# Patient Record
Sex: Female | Born: 1935 | Race: White | Hispanic: No | Smoking: Never smoker
Health system: Southern US, Community
[De-identification: ages and names within clinical notes are randomized; demographics above are authoritative.]

## PROBLEM LIST (undated history)

## (undated) DIAGNOSIS — R339 Retention of urine, unspecified: Secondary | ICD-10-CM

## (undated) DIAGNOSIS — I2699 Other pulmonary embolism without acute cor pulmonale: Secondary | ICD-10-CM

## (undated) DIAGNOSIS — E785 Hyperlipidemia, unspecified: Secondary | ICD-10-CM

## (undated) DIAGNOSIS — F209 Schizophrenia, unspecified: Secondary | ICD-10-CM

## (undated) DIAGNOSIS — I10 Essential (primary) hypertension: Secondary | ICD-10-CM

## (undated) DIAGNOSIS — I5030 Unspecified diastolic (congestive) heart failure: Secondary | ICD-10-CM

## (undated) HISTORY — PX: ABDOMINAL HYSTERECTOMY: SHX81

## (undated) HISTORY — PX: CHOLECYSTECTOMY: SHX55

---

## 2004-03-30 ENCOUNTER — Emergency Department (HOSPITAL_COMMUNITY): Admission: EM | Admit: 2004-03-30 | Discharge: 2004-03-30 | Payer: Self-pay | Admitting: Emergency Medicine

## 2005-07-01 ENCOUNTER — Inpatient Hospital Stay (HOSPITAL_COMMUNITY): Admission: EM | Admit: 2005-07-01 | Discharge: 2005-07-06 | Payer: Self-pay | Admitting: Emergency Medicine

## 2007-10-07 ENCOUNTER — Emergency Department (HOSPITAL_COMMUNITY): Admission: EM | Admit: 2007-10-07 | Discharge: 2007-10-07 | Payer: Self-pay | Admitting: Emergency Medicine

## 2008-09-14 ENCOUNTER — Emergency Department (HOSPITAL_COMMUNITY): Admission: EM | Admit: 2008-09-14 | Discharge: 2008-09-14 | Payer: Self-pay | Admitting: Emergency Medicine

## 2010-09-11 ENCOUNTER — Emergency Department (HOSPITAL_COMMUNITY)
Admission: EM | Admit: 2010-09-11 | Discharge: 2010-09-12 | Disposition: A | Discharge status: Skilled Nursing Facility | Payer: Medicare Other | Attending: Emergency Medicine | Admitting: Emergency Medicine

## 2010-09-11 ENCOUNTER — Emergency Department (HOSPITAL_COMMUNITY): Payer: Medicare Other

## 2010-09-11 DIAGNOSIS — S1093XA Contusion of unspecified part of neck, initial encounter: Secondary | ICD-10-CM | POA: Insufficient documentation

## 2010-09-11 DIAGNOSIS — E119 Type 2 diabetes mellitus without complications: Secondary | ICD-10-CM | POA: Insufficient documentation

## 2010-09-11 DIAGNOSIS — I10 Essential (primary) hypertension: Secondary | ICD-10-CM | POA: Insufficient documentation

## 2010-09-11 DIAGNOSIS — Y9210 Unspecified residential institution as the place of occurrence of the external cause: Secondary | ICD-10-CM | POA: Insufficient documentation

## 2010-09-11 DIAGNOSIS — E785 Hyperlipidemia, unspecified: Secondary | ICD-10-CM | POA: Insufficient documentation

## 2010-09-11 DIAGNOSIS — S0003XA Contusion of scalp, initial encounter: Secondary | ICD-10-CM | POA: Insufficient documentation

## 2010-09-11 DIAGNOSIS — W1809XA Striking against other object with subsequent fall, initial encounter: Secondary | ICD-10-CM | POA: Insufficient documentation

## 2010-09-12 LAB — GLUCOSE, CAPILLARY: Glucose-Capillary: 110 mg/dL — ABNORMAL HIGH (ref 70–99)

## 2010-09-17 ENCOUNTER — Emergency Department (HOSPITAL_COMMUNITY)
Admission: EM | Admit: 2010-09-17 | Discharge: 2010-09-17 | Disposition: A | Discharge status: Home / Self Care | Payer: Medicare Other | Attending: Emergency Medicine | Admitting: Emergency Medicine

## 2010-09-17 ENCOUNTER — Emergency Department (HOSPITAL_COMMUNITY): Payer: Medicare Other

## 2010-09-17 DIAGNOSIS — I517 Cardiomegaly: Secondary | ICD-10-CM | POA: Insufficient documentation

## 2010-09-17 DIAGNOSIS — E785 Hyperlipidemia, unspecified: Secondary | ICD-10-CM | POA: Insufficient documentation

## 2010-09-17 DIAGNOSIS — S0003XA Contusion of scalp, initial encounter: Secondary | ICD-10-CM | POA: Insufficient documentation

## 2010-09-17 DIAGNOSIS — Y9210 Unspecified residential institution as the place of occurrence of the external cause: Secondary | ICD-10-CM | POA: Insufficient documentation

## 2010-09-17 DIAGNOSIS — E119 Type 2 diabetes mellitus without complications: Secondary | ICD-10-CM | POA: Insufficient documentation

## 2010-09-17 DIAGNOSIS — I10 Essential (primary) hypertension: Secondary | ICD-10-CM | POA: Insufficient documentation

## 2010-09-17 DIAGNOSIS — E041 Nontoxic single thyroid nodule: Secondary | ICD-10-CM | POA: Insufficient documentation

## 2010-09-17 DIAGNOSIS — W010XXA Fall on same level from slipping, tripping and stumbling without subsequent striking against object, initial encounter: Secondary | ICD-10-CM | POA: Insufficient documentation

## 2010-09-17 DIAGNOSIS — S1093XA Contusion of unspecified part of neck, initial encounter: Secondary | ICD-10-CM | POA: Insufficient documentation

## 2010-09-17 LAB — GLUCOSE, CAPILLARY: Glucose-Capillary: 101 mg/dL — ABNORMAL HIGH (ref 70–99)

## 2010-09-29 LAB — URINALYSIS, ROUTINE W REFLEX MICROSCOPIC
Bilirubin Urine: NEGATIVE
Glucose, UA: NEGATIVE mg/dL
Ketones, ur: NEGATIVE mg/dL
Leukocytes, UA: NEGATIVE
Urobilinogen, UA: 0.2 mg/dL (ref 0.0–1.0)
pH: 6 (ref 5.0–8.0)

## 2010-09-29 LAB — POCT I-STAT, CHEM 8
BUN: 25 mg/dL — ABNORMAL HIGH (ref 6–23)
Calcium, Ion: 1 mmol/L — ABNORMAL LOW (ref 1.12–1.32)
Creatinine, Ser: 0.9 mg/dL (ref 0.4–1.2)
Glucose, Bld: 76 mg/dL (ref 70–99)
Potassium: 3.2 mEq/L — ABNORMAL LOW (ref 3.5–5.1)
TCO2: 18 mmol/L (ref 0–100)

## 2010-09-29 LAB — URINE MICROSCOPIC-ADD ON

## 2010-11-04 NOTE — Discharge Summary (Signed)
NAME:  [name], [name] NO.:  [id number]   MEDICAL RECORD NO.:  [id number]          PATIENT TYPE:  INP   LOCATION:  1324                         FACILITY:  [facility]   PHYSICIAN:  Deirdre Peer. Polite, M.D. DATE OF BIRTH:  1934/09/03   DATE OF ADMISSION:  07/01/2005  DATE OF DISCHARGE:                                 DISCHARGE SUMMARY   DISCHARGE DIAGNOSES:  1.  Mental status change multifactorial in etiology, improved at time of      discharge.  2.  Uncontrolled diabetes. Hemoglobin A1c 10.3. Please note outpatient      noncompliance with medications. New medications started Amaryl, Lantus,      actos.  3.  Hypertension, stable.  4.  Schizophrenia appears to be at baseline. Will continue with Zyprexa 20      mg q.h.s.  5.  Poor safety awareness requiring assisted living care.   DISCHARGE MEDICATIONS:  1.  Actos 30 mg daily.  2.  Amaryl 8 mg daily.  3.  Lantus 26 units daily.  4.  Zyprexa 20 mg daily.  5.  Lisinopril 20 mg daily.  6.  Hydrochlorothiazide 25 mg daily.  7.  Lipitor 20 mg daily.   Please note to consider Aricept to complete resolution of acute psychosis at  5 mg daily.   DISPOSITION:  The patient to be discharged to assisted living facility.   CONSULTATIONS:  Antonietta Breach, M.D., psychiatry.   HISTORY OF PRESENT ILLNESS:  A 76 year old female with above medical  problems brought to the ED for confusion. In the ED, the patient was  evaluated, had screening labs without any significant abnormalities other  than hyperglycemia. History suggested the patient has been not caring for  herself well at home and having some stool incontinence as well as bladder  incontinence. Admission was deemed necessary for further evaluation and  treatment. Please see dictated H&P for further details.   PAST MEDICAL HISTORY:  As stated above.   PAST SURGICAL HISTORY:  Significant for cholecystectomy, hysterectomy.   ALLERGIES:  None.   MEDICATIONS:  As stated  above.   FAMILY HISTORY:  Noncontributory.   HOSPITAL COURSE:  The patient was admitted to a medicine floor bed for  evaluation and treatment of mental status change. The patient was pan  cultured, UA was negative, C Dif negative. The patient had BMET, CBC which  were within normal limits. The patient's hemoglobin A1c was elevated at 10.2  suggesting the need for more medicines versus noncompliance with her  existing medications. The patient was seen in consultation by psychiatry and  was recommended to continue Zyprexa and to consider  Aricept after acute  psychosis resolved. There were no complications during this hospitalization  except for mild hyperglycemia and the patient's medications were titrated  appropriately. The patient at this time will be discharged to an assisted  living facility per the family's request and will primarily need titration  of her psychotropic medicines. May need addition of Aricept and monitoring  of her CBGs and shifting her medicines appropriately. At this time, the  patient is medically  stable for discharge.      Deirdre Peer. Polite, M.D.  Electronically Signed     RDP/MEDQ  D:  07/06/2005  T:  07/06/2005  Job:  [id number]   cc:   Dr. __________

## 2010-11-04 NOTE — H&P (Signed)
NAME:  [name], [name] NO.:  [id number]   MEDICAL RECORD NO.:  [id number]          PATIENT TYPE:  INP   LOCATION:  1324                         FACILITY:  [facility]   PHYSICIAN:  Thora Lance, M.D.  DATE OF BIRTH:  07/14/1934   DATE OF ADMISSION:  07/01/2005  DATE OF DISCHARGE:                                HISTORY & PHYSICAL   CHIEF COMPLAINT:  Confusion.   HISTORY OF PRESENT ILLNESS:  This is a 76 year old white female who is  accompanied by her son, a Occupational psychologist, Dr. Deanne Coffer. The patient has a  history of diabetes mellitus, hypertension, and schizophrenia. The patient  was noticed by her son last night to be modestly confused. The patient is on  medications for diabetes, high cholesterol, hypertension, and schizophrenia  but admits to not taking any of her Lipitor, lisinopril, and metformin over  the last at least 3 weeks and only taking her Zyprexa sporadically. She has  probably been having more frequent urination and has lost some weight. She  has had problems with fecal and urinary incontinence per her son. Her son  reports that she has had increased paranoia with decreased sleep and  possibly hallucinations which she terms interrupters. The patient has a  visiting nurse which checks on her several times a week but she has not let  her into the house over the last several visits. The patient lives alone but  her son feels that given her psychiatric problems that placement is more  appropriate at this time and he has been visiting assisted living  facilities.   PAST MEDICAL HISTORY:  1.  Hypertension.  2.  Diabetes mellitus.  3.  Schizophrenia, Dr. Earnstine Regal.   PAST SURGICAL HISTORY:  Cholecystectomy and total abdominal hysterectomy.   ALLERGIES:  None.   MEDICATIONS:  As prescribed:  1.  Zyprexa 20 mg p.o. q.h.s.  2.  Lipitor 20 mg p.o. q.a.m.  3.  Lisinopril/hydrochlorothiazide 20/25 mg p.o. daily.  4.  Metformin 850 mg p.o. b.i.d.   FAMILY HISTORY:  Unremarkable.   SOCIAL HISTORY:  The patient is divorced. She lives alone. She drinks  alcohol sparingly. She is a nonsmoker.   REVIEW OF SYSTEMS:  Believes she has lost some weight in the last few weeks.  She denies fevers, chills, cough, chest pain, abdominal pain, diarrhea,  constipation, urinary pain, headache.   PHYSICAL EXAMINATION:  GENERAL:  Alert elderly female. Her speech is  rambling and tangential.  VITAL SIGNS:  Temperature 98.7. Blood pressure initially 198/120, rechecked  158/90 in the right, 84/56 on the left. Heart 101, respirations 16, oxygen  saturation 94% on room air.  HEENT:  Pupils equal and respond to light. Extraocular movements are intact.  Fundi within normal limits. TMs are clear. Oropharynx is clear.  NECK:  Supple, no lymphadenopathy, no carotid bruits.  LUNGS:  Clear.  HEART:  Regular rate and rhythm without murmur, gallop, or rub.  ABDOMEN:  Soft, nontender, normal bowel sounds, no masses.  PELVIC AND RECTAL:  Deferred.  EXTREMITIES:  Show no edema. There is no callus  or ulceration on her feet,  2+ pedal pulses.  NEUROLOGIC:  Nonfocal. Cranial nerves II-XII are intact. Strength is 5/5 in  all extremities.   LABORATORY:  CBC:  WBC 9.1, hemoglobin 15.4, platelet count 286. Chemistry:  Sodium 130, potassium 4.0, chloride 94, bicarbonate 28, BUN 10, creatinine  0.7, platelets 355. Liver function tests normal. Urinalysis pending. CT scan  of the brain shows chronic ischemic white matter disease, no acute changes.   ASSESSMENT:  1.  Mild confusion.  2.  Schizophrenia with active symptoms of paranoia and probable      hallucinations. She is off medications.  3.  Diabetes mellitus, poorly controlled, off medications.  4.  Hypertension, poorly controlled, off medications.   PLAN:  Admit to medical bed. Restart medications. IV fluids. Sliding scale  insulin. Physical therapy. Case manager consult for placement.            ______________________________  Thora Lance, M.D.     JJG/MEDQ  D:  07/01/2005  T:  07/02/2005  Job:  [id number]   cc:   Deatra James, M.D.  Fax: [phone]

## 2010-12-02 ENCOUNTER — Emergency Department (HOSPITAL_COMMUNITY): Payer: Medicare Other

## 2010-12-02 ENCOUNTER — Inpatient Hospital Stay (HOSPITAL_COMMUNITY)
Admission: EM | Admit: 2010-12-02 | Discharge: 2010-12-05 | DRG: 638 | Disposition: A | Discharge status: Nursing Home (Includes ICF) | Payer: Medicare Other | Attending: Internal Medicine | Admitting: Internal Medicine

## 2010-12-02 DIAGNOSIS — E785 Hyperlipidemia, unspecified: Secondary | ICD-10-CM | POA: Diagnosis present

## 2010-12-02 DIAGNOSIS — E1169 Type 2 diabetes mellitus with other specified complication: Principal | ICD-10-CM | POA: Diagnosis present

## 2010-12-02 DIAGNOSIS — W06XXXA Fall from bed, initial encounter: Secondary | ICD-10-CM | POA: Diagnosis present

## 2010-12-02 DIAGNOSIS — N318 Other neuromuscular dysfunction of bladder: Secondary | ICD-10-CM | POA: Diagnosis present

## 2010-12-02 DIAGNOSIS — N390 Urinary tract infection, site not specified: Secondary | ICD-10-CM | POA: Diagnosis present

## 2010-12-02 DIAGNOSIS — S0100XA Unspecified open wound of scalp, initial encounter: Secondary | ICD-10-CM | POA: Diagnosis present

## 2010-12-02 DIAGNOSIS — F0390 Unspecified dementia without behavioral disturbance: Secondary | ICD-10-CM | POA: Diagnosis present

## 2010-12-02 DIAGNOSIS — Y9210 Unspecified residential institution as the place of occurrence of the external cause: Secondary | ICD-10-CM | POA: Diagnosis present

## 2010-12-02 DIAGNOSIS — IMO0002 Reserved for concepts with insufficient information to code with codable children: Secondary | ICD-10-CM | POA: Diagnosis present

## 2010-12-02 DIAGNOSIS — F209 Schizophrenia, unspecified: Secondary | ICD-10-CM | POA: Diagnosis present

## 2010-12-02 DIAGNOSIS — I10 Essential (primary) hypertension: Secondary | ICD-10-CM | POA: Diagnosis present

## 2010-12-02 LAB — GLUCOSE, CAPILLARY

## 2010-12-02 LAB — CBC
Hemoglobin: 13.8 g/dL (ref 12.0–15.0)
MCV: 97.9 fL (ref 78.0–100.0)
RBC: 4.21 MIL/uL (ref 3.87–5.11)
RDW: 14 % (ref 11.5–15.5)

## 2010-12-03 LAB — GLUCOSE, CAPILLARY
Glucose-Capillary: 55 mg/dL — ABNORMAL LOW (ref 70–99)
Glucose-Capillary: 90 mg/dL (ref 70–99)
Glucose-Capillary: 76 mg/dL (ref 70–99)
Glucose-Capillary: 108 mg/dL — ABNORMAL HIGH (ref 70–99)
Glucose-Capillary: 75 mg/dL (ref 70–99)
Glucose-Capillary: 84 mg/dL (ref 70–99)
Glucose-Capillary: 101 mg/dL — ABNORMAL HIGH (ref 70–99)

## 2010-12-03 LAB — BASIC METABOLIC PANEL
Calcium: 8.7 mg/dL (ref 8.4–10.5)
GFR calc Af Amer: >60 mL/min (ref >60)
GFR calc Af Amer: >60 mL/min (ref >60)
GFR calc non Af Amer: >60 mL/min (ref >60)
GFR calc non Af Amer: >60 mL/min (ref >60)
Glucose, Bld: 52 mg/dL — ABNORMAL LOW (ref 70–99)
Potassium: 3.5 mEq/L (ref 3.5–5.1)
Potassium: 3.9 mEq/L (ref 3.5–5.1)
Sodium: 137 mEq/L (ref 135–145)
Sodium: 140 mEq/L (ref 135–145)

## 2010-12-03 LAB — URINALYSIS, ROUTINE W REFLEX MICROSCOPIC
Glucose, UA: NEGATIVE mg/dL
Protein, ur: NEGATIVE mg/dL
pH: 6.5 (ref 5.0–8.0)

## 2010-12-03 LAB — CBC
MCH: 32.3 pg (ref 26.0–34.0)
MCHC: 32.5 g/dL (ref 30.0–36.0)
RDW: 14.1 % (ref 11.5–15.5)

## 2010-12-03 LAB — URINE MICROSCOPIC-ADD ON

## 2010-12-04 LAB — GLUCOSE, CAPILLARY
Glucose-Capillary: 112 mg/dL — ABNORMAL HIGH (ref 70–99)
Glucose-Capillary: 126 mg/dL — ABNORMAL HIGH (ref 70–99)
Glucose-Capillary: 131 mg/dL — ABNORMAL HIGH (ref 70–99)
Glucose-Capillary: 146 mg/dL — ABNORMAL HIGH (ref 70–99)
Glucose-Capillary: 92 mg/dL (ref 70–99)

## 2010-12-05 LAB — URINALYSIS, ROUTINE W REFLEX MICROSCOPIC
Bilirubin Urine: NEGATIVE
Glucose, UA: NEGATIVE mg/dL
Hgb urine dipstick: NEGATIVE
Ketones, ur: NEGATIVE mg/dL
Specific Gravity, Urine: 1.011 (ref 1.005–1.030)
pH: 6 (ref 5.0–8.0)

## 2010-12-05 LAB — GLUCOSE, CAPILLARY

## 2010-12-05 LAB — BASIC METABOLIC PANEL
CO2: 28 mEq/L (ref 19–32)
Calcium: 9.1 mg/dL (ref 8.4–10.5)
Creatinine, Ser: 0.54 mg/dL (ref 0.50–1.10)
GFR calc non Af Amer: >60 mL/min (ref >60)
Glucose, Bld: 92 mg/dL (ref 70–99)
Sodium: 138 mEq/L (ref 135–145)

## 2010-12-06 LAB — URINE CULTURE
Colony Count: NO GROWTH
Culture  Setup Time: 201206181355
Culture: NO GROWTH
Special Requests: NEGATIVE

## 2010-12-12 ENCOUNTER — Emergency Department (HOSPITAL_COMMUNITY)
Admission: EM | Admit: 2010-12-12 | Discharge: 2010-12-12 | Disposition: A | Discharge status: Home / Self Care | Payer: Medicare Other | Attending: Emergency Medicine | Admitting: Emergency Medicine

## 2010-12-12 ENCOUNTER — Emergency Department (HOSPITAL_COMMUNITY): Payer: Medicare Other

## 2010-12-12 DIAGNOSIS — Y9210 Unspecified residential institution as the place of occurrence of the external cause: Secondary | ICD-10-CM | POA: Insufficient documentation

## 2010-12-12 DIAGNOSIS — S0990XA Unspecified injury of head, initial encounter: Secondary | ICD-10-CM | POA: Insufficient documentation

## 2010-12-12 DIAGNOSIS — Z79899 Other long term (current) drug therapy: Secondary | ICD-10-CM | POA: Insufficient documentation

## 2010-12-12 DIAGNOSIS — M47812 Spondylosis without myelopathy or radiculopathy, cervical region: Secondary | ICD-10-CM | POA: Insufficient documentation

## 2010-12-12 DIAGNOSIS — S022XXA Fracture of nasal bones, initial encounter for closed fracture: Secondary | ICD-10-CM | POA: Insufficient documentation

## 2010-12-12 DIAGNOSIS — W010XXA Fall on same level from slipping, tripping and stumbling without subsequent striking against object, initial encounter: Secondary | ICD-10-CM | POA: Insufficient documentation

## 2010-12-12 DIAGNOSIS — E785 Hyperlipidemia, unspecified: Secondary | ICD-10-CM | POA: Insufficient documentation

## 2010-12-12 DIAGNOSIS — I10 Essential (primary) hypertension: Secondary | ICD-10-CM | POA: Insufficient documentation

## 2010-12-12 DIAGNOSIS — E119 Type 2 diabetes mellitus without complications: Secondary | ICD-10-CM | POA: Insufficient documentation

## 2010-12-22 NOTE — Discharge Summary (Signed)
[name], [name] NO.:  [id number]  MEDICAL RECORD NO.:  [id number]  LOCATION:  1517                         FACILITY:  [facility]  PHYSICIAN:  Clydia Llano, MD       DATE OF BIRTH:  17-Nov-1934  DATE OF ADMISSION:  12/02/2010 DATE OF DISCHARGE:                        DISCHARGE SUMMARY - REFERRING   REASON FOR ADMISSION:  Fall.  DISCHARGE DIAGNOSES: 1. Hypoglycemia in type 2 diabetes. 2. Type 2 diabetes mellitus. 3. Status post fall secondary to hypoglycemia. 4. Dyslipidemia. 5. Schizophrenia. 6. Hypertension. 7. Dementia. 8. Overactive bladder. 9. Questionable UTI.  DISCHARGE MEDICATIONS: 1. Ciprofloxacin 500 mg b.i.d. take for 4 days. 2. Diclofenac 50 mg b.i.d. as needed for pain. 3. Aspirin 81 mg p.o. daily. 4. Benztropine 1 mg p.o. b.i.d. 5. Enablex 7.5 mg p.o. daily. 6. Fish oil 1000 mg p.o. b.i.d. 7. Lisinopril 10 mg p.o. daily. 8. Nystatin 100,000 units per gram powder applied topically twice    daily as needed to affected area. 9. Perphenazine 8 mg take 1 tablet twice daily at 9 and 2 p.m. and 5     tablets at bedtime at 9. 10.Simvastatin 40 mg p.o. q.h.s. 11.Therapeutic gel 0.5% shampoo applied topically as needed for     washing hair. 12.Vitamin E 200 units 1 capsule p.o. daily.  Stop taking the following medications: 1. Actos 45 mg p.o. daily. 2. Glimepiride 4 mg p.o. daily.  Hold the following medications: 1. Amaryl 4 mg p.o. daily. 2. Actos 45 mg p.o. daily.  RADIOLOGY: 1. Chest x-ray on December 03, 2010, cardiac enlargement, hypoventilation     with bibasilar atelectasis. 2. CT scan of the head negative for cervical fracture.  CT scan of the     head showed chronic microvascular ischemia. 3. CT scan of the spine on December 03, 2010, showed negative for cervical     fracture.  There is 2.4 cm left lower lobe thyroid nodule.     Recommend thyroid ultrasound on elective basis.  BRIEF HISTORY EXAMINATION:  [name] is a 76 year old  female from [facility], reports to fell out of the bed and hit in the back of her head today.  The patient has history diabetes, hypertension and schizophrenia and dementia.  The patient taking oral hypoglycemic agents Actos and glimepiride.  Her blood sugar was checked and she is being found to have 47.  EMS gave D50 and when the patient arrived in the emergency department, her blood sugar was found to be in the 80s.  Her blood sugar will not be able to sustain above 80.  The patient was started on D5 with normal saline and that time the patient reported not eating well.  She was started recently on Bactrim.  BRIEF HOSPITAL STAY: 1. Hypoglycemia and type 2 diabetes mellitus.  The hypoglycemia is     likely secondary to poor oral intake and the effect of the oral     hypoglycemic agents.  The patient was started recently on Bactrim     likely because of UTI.  Both Bactrim and UTI caused loss of     appetite and this has probably contributed to the hypoglycemia.  The patient was put on D5 half normal saline for 1 day and the next     day kept monitored in the hospital to check her blood sugar.  Blood     sugar has been stable with the oral hypoglycemic on hold, the     highest blood sugar reached is 131.  So it was also held on     discharge and that might be restarted if necessary but for now the     patient does not need them.  The patient will be on carbohydrate-     modified diet. 2. Status post fall.  The patient fell.  She had some abrasion in the     left great toe and scalp and bruises in the left forearm.  This is     likely secondary to the fall like secondary to the hypoglycemia.     The patient has been stable in the hospital. 3. Diabetes mellitus type 2.  As in #1, oral hypoglycemic will be on     hold.  Restarting these medication need to be readdressed at the     nursing home. 4. Schizophrenia and dementia, stable.  The patient did not show any      psychotic or depressive mood in the hospital.  Her dementia has     also been stable. 5. Questionable UTI.  The patient has been recently on Bactrim, my     guess it was because of UTI.  The patient upon admission to the     hospital has urine with positive leukocyte esterase but no pyuria.     The patient was started on ciprofloxacin because she had low grade     fever in the hospital and I will treat her for 5 days empirically.  DISCHARGE INSTRUCTIONS: 1. Disposition:  Skilled nursing facility. 2. Activity:  As tolerated. 3. Diet:  Carbohydrate-modified diabetic diet.     Clydia Llano, MD     ME/MEDQ  D:  12/05/2010  T:  12/05/2010  Job:  [id number]  cc:   Deatra James, M.D. Fax: [phone]  Electronically Signed by Clydia Llano  on 12/22/2010 02:35:24 PM

## 2011-01-09 NOTE — H&P (Signed)
[name], [name] NO.:  [id number]  MEDICAL RECORD NO.:  [id number]  LOCATION:  1517                         FACILITY:  [facility]  PHYSICIAN:  Della Goo, M.D. DATE OF BIRTH:  December 09, 1934  DATE OF ADMISSION:  12/02/2010 DATE OF DISCHARGE:                             HISTORY & PHYSICAL   PRIMARY CARE PHYSICIAN:  [facility], Dr. Wynelle Link.  CHIEF COMPLAINT:  Fall.  HISTORY OF PRESENT ILLNESS:  This is a 76 year old female from the [facility] who reportedly fell out of bed, hitting the back of her head.  She has a history of diabetes and is on oral diabetic medications of Actos and glimepiride, so a blood sugar was checked and she had been found to have a glucose level of 47.  EMS gave D50, and when the patient arrived in the emergency department her blood sugar was found to be 80.  Her blood sugars were not able to sustain above 80 and the patient had to be administered D50 twice and then was placed on an IV drip of D5 normal saline.  The patient reports not eating well over the past 24 hours, she states her last meal was at breakfast.  She reports not eating lunch or dinner.  PAST MEDICAL HISTORY: 1. Type 2 diabetes mellitus. 2. Dyslipidemia. 3. Hypertension. 4. Schizophrenia. 5. Dementia. 6. Overactive bladder.  MEDICATIONS:  Aspirin, Enablex, lisinopril, vitamin D, perphenazine, benztropine, fish oil, simvastatin, nystatin powder, and Septra.  ALLERGIES:  No known drug allergies.  SOCIAL HISTORY:  The patient resides at the [facility].  She is a nonsmoker, nondrinker and has no history of illicit drug usage.  FAMILY HISTORY:  Unable to obtain.  REVIEW OF SYSTEMS:  Unable to obtain.  PHYSICAL EXAMINATION:  GENERAL:  This is a morbidly obese 76 year old Caucasian female who is in no acute distress currently. VITAL SIGNS:  Temperature 98.2, blood pressure 109/71, heart rate  91, respirations 18, O2 saturations 88% to 98% on supplemental oxygen. HEENT:  Normocephalic, atraumatic except for a posterior scalp laceration that has 1 staple that has been placed to oppose the margins. Pupils are equally round and reactive to light.  Extraocular movements are intact.  Funduscopic benign.  There is no scleral icterus.  Nares are patent bilaterally.  Oropharynx is clear.  There are no tongue lacerations.  Dentition is poor. NECK:  Supple.  Full range of motion.  No thyromegaly, adenopathy, or jugular venous distention. CARDIOVASCULAR:  Regular rate and rhythm.  Normal S1 and S2.  No murmurs, gallops, or rubs appreciated.  Chest wall nontender.  No visible ecchymoses, masses, lesions, or ulcers present.  Nontender to palpation. LUNGS:  Clear to auscultation bilaterally.  No rales, rhonchi, or wheezes. ABDOMEN:  Positive bowel sounds.  Soft, nontender, and nondistended.  No hepatosplenomegaly.  Obese. EXTREMITIES:  Without cyanosis, clubbing, or edema. NEUROLOGIC:  The patient is alert and oriented x3.  Her speech is garbled, this is chronic.  She is able to move all 4 of her extremities. SKIN:  The patient has the posterior scalp laceration present.  She also has an abrasion on the medial aspect  of the left first metatarsal area. The patient has small bruises on the anterior and distal aspects of both lower extremities.  LABORATORY STUDIES:  White blood cell count 11.9, hemoglobin 13.8, hematocrit 41.2, MCV 97.9, platelets 152.  Sodium 137, potassium 3.5, chloride 102, carbon dioxide 25, BUN 11, creatinine 0.84, and glucose 52.  Urinalysis negative except for trace leukocyte esterase.  Urine white blood cells 0-2, urine red blood cells 0-2.  RADIOLOGICAL STUDIES:  CT scan of the head, negative for any acute findings of infarction, hemorrhage, or mass lesion.  Negative for skull fracture.  Mild chronic microvascular ischemic changes are seen and they are  unchanged.  Age-appropriate atrophy is also seen.  CT scan of the C- spine, negative for fracture.  Cervical kyphosis is present.  Chest x- ray reveals mild cardiac enlargement and hypoventilation with bibasilar atelectasis seen.  ASSESSMENT:  A 76 year old female being admitted with, 1. Hypoglycemia, most likely secondary to poor p.o. intake and being     on oral hypoglycemic medications. 2. Hypoxemia. 3. Hypertension. 4. Dyslipidemia. 5. Schizophrenia. 6. Dementia. 7. Morbid obesity. 8. Posterior scalp laceration. 9. Abrasion, left foot. 10.Status post fall.  PLAN:  The patient will be admitted to telemetry area for monitoring and a continuous pulse oximetry has also been ordered to monitor her O2 saturations.  Supplemental oxygen has also been ordered.  The patient has been placed on the IV drip with D5 normal saline to help stabilize her blood sugars for now and her blood sugars will be checked every hour x6 and then q.4 h. with sliding scale insulin coverage.  The Actos therapy that she has been on along with the glimepiride therapy has been discontinued for now.  The hypoglycemic protocol will be followed and her regularmedications will be further reconciled and further workup will ensue pending results of the patient's clinical course.  Wound care will be provided for the posterior scalp laceration and the foot wound.  The patient will be placed on DVT prophylaxis and code status is a full code at this time.     Della Goo, M.D.     HJ/MEDQ  D:  12/03/2010  T:  12/03/2010  Job:  [id number]  Electronically Signed by Della Goo M.D. on 01/09/2011 11:06:49 AM

## 2011-03-14 LAB — URINALYSIS, ROUTINE W REFLEX MICROSCOPIC
Bilirubin Urine: NEGATIVE
Glucose, UA: NEGATIVE
Hgb urine dipstick: NEGATIVE
Ketones, ur: NEGATIVE
Protein, ur: NEGATIVE
Urobilinogen, UA: 1

## 2011-03-14 LAB — POCT I-STAT, CHEM 8
BUN: 15
Calcium, Ion: 1.2
Creatinine, Ser: 1.1
Hemoglobin: 13.3
Sodium: 139
TCO2: 30

## 2011-03-14 LAB — URINE MICROSCOPIC-ADD ON

## 2011-03-14 LAB — URINE CULTURE: Colony Count: >=100000

## 2011-10-29 ENCOUNTER — Emergency Department (HOSPITAL_COMMUNITY)
Admission: EM | Admit: 2011-10-29 | Discharge: 2011-10-29 | Disposition: A | Discharge status: Skilled Nursing Facility | Payer: Medicare Other | Attending: Emergency Medicine | Admitting: Emergency Medicine

## 2011-10-29 ENCOUNTER — Encounter (HOSPITAL_COMMUNITY): Payer: Self-pay | Admitting: Emergency Medicine

## 2011-10-29 ENCOUNTER — Emergency Department (HOSPITAL_COMMUNITY): Payer: Medicare Other

## 2011-10-29 DIAGNOSIS — Z8659 Personal history of other mental and behavioral disorders: Secondary | ICD-10-CM | POA: Insufficient documentation

## 2011-10-29 DIAGNOSIS — E119 Type 2 diabetes mellitus without complications: Secondary | ICD-10-CM | POA: Insufficient documentation

## 2011-10-29 DIAGNOSIS — I10 Essential (primary) hypertension: Secondary | ICD-10-CM | POA: Insufficient documentation

## 2011-10-29 DIAGNOSIS — S0003XA Contusion of scalp, initial encounter: Secondary | ICD-10-CM | POA: Insufficient documentation

## 2011-10-29 DIAGNOSIS — Z79899 Other long term (current) drug therapy: Secondary | ICD-10-CM | POA: Insufficient documentation

## 2011-10-29 DIAGNOSIS — W19XXXA Unspecified fall, initial encounter: Secondary | ICD-10-CM

## 2011-10-29 DIAGNOSIS — S1093XA Contusion of unspecified part of neck, initial encounter: Secondary | ICD-10-CM | POA: Insufficient documentation

## 2011-10-29 DIAGNOSIS — W07XXXA Fall from chair, initial encounter: Secondary | ICD-10-CM | POA: Insufficient documentation

## 2011-10-29 DIAGNOSIS — I491 Atrial premature depolarization: Secondary | ICD-10-CM | POA: Insufficient documentation

## 2011-10-29 DIAGNOSIS — Y9210 Unspecified residential institution as the place of occurrence of the external cause: Secondary | ICD-10-CM | POA: Insufficient documentation

## 2011-10-29 DIAGNOSIS — E785 Hyperlipidemia, unspecified: Secondary | ICD-10-CM | POA: Insufficient documentation

## 2011-10-29 DIAGNOSIS — S0093XA Contusion of unspecified part of head, initial encounter: Secondary | ICD-10-CM

## 2011-10-29 HISTORY — DX: Hyperlipidemia, unspecified: E78.5

## 2011-10-29 HISTORY — DX: Essential (primary) hypertension: I10

## 2011-10-29 HISTORY — DX: Schizophrenia, unspecified: F20.9

## 2011-10-29 NOTE — ED Notes (Addendum)
During routine vitals pt's 02 level stated 87%. Johnny Bridge RN was notified. Pt probe was changed and alternated to different fingers and then relocated to pt's ear. Oxygen saturation continued to stat 90%. Pt's heart rate was also noted to had decreased to mid 30s. ERP Lynelle Doctor was notified and pt was placed on EKG monitoring

## 2011-10-29 NOTE — ED Notes (Addendum)
EKG performed and given to ERP [facility] along with previous EKG

## 2011-10-29 NOTE — ED Notes (Addendum)
Pt brought in by EMS from [facility] s/p fall pt sated that she sat in a wooden that was to small and it broke.C/o lower back pain denies loc

## 2011-10-29 NOTE — ED Notes (Signed)
EKG irregular.  To perform 12 lead per Dr. Lynelle Doctor

## 2011-10-29 NOTE — ED Notes (Signed)
Bed:WA22<BR> Expected date:<BR> Expected time:<BR> Means of arrival:<BR> Comments:<BR> EMS fall °

## 2011-10-29 NOTE — ED Notes (Addendum)
Notified by [facility] that pt heart rate ranging from 30-50 on pulse oximetry.  Pt radial pulse is very irregular.  Notified MD.  Place pt on EKG monitor.  Per son, pt does not have hx of irregular heart rate. Also, oxygen level 85-99 % range.

## 2011-10-29 NOTE — ED Notes (Signed)
Oxygen remains upper 80% on ra.  Pleth good on monitor.  Good cap refill.  Hands warm.  Notified MD.  Do ABG.

## 2011-10-29 NOTE — ED Provider Notes (Signed)
History     CSN: [id number]  Arrival date & time 10/29/11  4098   First MD Initiated Contact with Patient 10/29/11 1857      Chief Complaint  Patient presents with  . Fall    (Consider location/radiation/quality/duration/timing/severity/associated sxs/prior treatment) HPI  Patient presents from nursing home. She told me that she was walking without her walker and she fell forward. However EMS told the nurse that she was sitting in a small wooden chair and she fell. When I asked the patient about this she said yes that she was by the chair and she fell. It is not clear whether she was sitting in the chair she was walking by the chair and tripped. Patient also denies any pain to me. EMS reported she was having back pain but she specifically denies it when asked. She points to her right lateral eyebrow and states she has some pain and swelling there. She denies any pain in her extremities  PCP Dr. Wynelle Link  Past Medical History  Diagnosis Date  . Hypertension   . Diabetes mellitus   . Schizophrenia   . Hyperlipemia     Past Surgical History  Procedure Date  . Cholecystectomy     No family history on file.  History  Substance Use Topics  . Smoking status: No  . Smokeless tobacco: Not on file  . Alcohol Use: No  lives in NH Uses a walker  OB History    Grav Para Term Preterm Abortions TAB SAB Ect Mult Living                  Review of Systems  Unable to perform ROS: Psychiatric disorder    Allergies  Review of patient's allergies indicates no known allergies.  Home Medications   Current Outpatient Rx  Name Route Sig Dispense Refill  . ASPIRIN EC 81 MG PO TBEC Oral Take 81 mg by mouth daily.    Marland Kitchen BENZTROPINE MESYLATE 1 MG PO TABS Oral Take 1 mg by mouth 2 (two) times daily.    Marland Kitchen DARIFENACIN HYDROBROMIDE ER 7.5 MG PO TB24 Oral Take 7.5 mg by mouth daily.    . OMEGA-3 FATTY ACIDS 1000 MG PO CAPS Oral Take 2 g by mouth daily.    Marland Kitchen GLIMEPIRIDE 4 MG PO TABS Oral Take  4 mg by mouth daily before breakfast.    . LISINOPRIL 10 MG PO TABS Oral Take 10 mg by mouth daily.    Marland Kitchen LOPERAMIDE HCL 2 MG PO CAPS Oral Take 2 mg by mouth 4 (four) times daily as needed. For diarhea    . PERPHENAZINE 8 MG PO TABS Oral Take 8 mg by mouth 2 (two) times daily.    Marland Kitchen PIOGLITAZONE HCL 30 MG PO TABS Oral Take 30 mg by mouth daily.    Marland Kitchen SIMVASTATIN 40 MG PO TABS Oral Take 40 mg by mouth every evening.    Marland Kitchen VITAMIN E 200 UNITS PO CAPS Oral Take 200 Units by mouth daily.      BP 143/70  Pulse 75  Resp 17  SpO2 97%  Vital signs normal    Physical Exam  Nursing note and vitals reviewed. Constitutional: She appears well-developed and well-nourished.  Non-toxic appearance. She does not appear ill. No distress.  HENT:  Head: Normocephalic.  Right Ear: External ear normal.  Left Ear: External ear normal.  Nose: Nose normal. No mucosal edema or rhinorrhea.  Mouth/Throat: Oropharynx is clear and moist and mucous membranes are normal.  No dental abscesses or uvula swelling.       Patient has a small bruising with swelling of her right forehead in the hairline. Otherwise no other trauma seen to her head or face  Eyes: Conjunctivae and EOM are normal. Pupils are equal, round, and reactive to light.  Neck: Normal range of motion and full passive range of motion without pain. Neck supple.  Cardiovascular: Normal rate, regular rhythm and normal heart sounds.  Exam reveals no gallop and no friction rub.   No murmur heard. Pulmonary/Chest: Effort normal and breath sounds normal. No respiratory distress. She has no wheezes. She has no rhonchi. She has no rales. She exhibits no tenderness and no crepitus.  Abdominal: Soft. Normal appearance and bowel sounds are normal. She exhibits no distension. There is no tenderness. There is no rebound and no guarding.  Musculoskeletal: Normal range of motion. She exhibits no edema and no tenderness.       Moves all extremities well. Her back is nontender  to palpation. She moves freely without apparent discomfort.  Neurological: She is alert. She has normal strength. No cranial nerve deficit.       Patient appears to be a little confused  Skin: Skin is warm, dry and intact. No rash noted. No erythema. No pallor.  Psychiatric: She has a normal mood and affect. Her speech is normal and behavior is normal. Her mood appears not anxious.    ED Course  Procedures (including critical care time)  The patient had pulse ox placed on her finger during her ER visit and she was noted to have irregular heartbeat and some hypoxia. Patient was placed on cardiac monitor she was noted to have a heart rate in the mid 50s up to 70 with frequent PACs. EKG was done and verified the were PACs and not a wenkebach arrhythmia. Her pulse ox was in the high 80s. Blood gas was ordered however the respiratory therapist repositioned the patient and got a better waveform and states her pulse ox was 93 and 96% on room air. At this point her son who is a physician  did not want to pursue the blood gas. He states he will have her followed up with her family physician for the irregular heartbeat. Patient has no chest pain, shortness of breath, and her color is good.` Patient also has no cough.  Labs Reviewed - No data to display Ct Head Wo Contrast  10/29/2011  *RADIOLOGY REPORT*  Clinical Data: Fall.  Right frontal contusion.  CT HEAD WITHOUT CONTRAST  Technique:  Contiguous axial images were obtained from the base of the skull through the vertex without contrast.  Comparison: 12/12/2010  Findings: Vertebral artery atherosclerotic calcification noted. The cerebellum, brain stem, and cerebral peduncles appear unremarkable.  Faint hypodensity in the right thalamus likely represents a remote small lacunar infarct.  The basal ganglia appear unremarkable.  The ventricular system and basilar cisterns appear normal.  Periventricular and corona radiata white matter hypodensities are most  compatible with chronic ischemic microvascular white matter disease.  No intracranial hemorrhage, mass lesion, or acute infarction is identified.  Mild fundal scalp contusion noted.  There is degenerative spurring of the right mandibular condyle.  IMPRESSION:  1.  Mild right frontal scalp contusion.  No acute intracranial findings. 2. Periventricular and corona radiata white matter hypodensities are most compatible with chronic ischemic microvascular white matter disease. 3.  Probable remote lacunar infarct involving the right thalamus. 4.  Degenerative spurring of the right mandibular condyle.  Original Report Authenticated By: Dellia Cloud, M.D.    Date: 10/29/2011  Rate: 67  Rhythm: normal sinus rhythm and premature atrial contractions (PAC)  QRS Axis: normal  Intervals: normal  ST/T Wave abnormalities: normal  Conduction Disutrbances:left anterior fascicular block  Narrative Interpretation:   Old EKG Reviewed: changes noted from 12/03/2010 now has frequent PAC's    1. Fall   2. Contusion of head   3. PAC (premature atrial contraction)    Plan discharge  Devoria Albe, MD, FACEP    MDM          Ward Givens, MD 10/29/11 2108

## 2011-10-29 NOTE — Discharge Instructions (Signed)
Ice packs to the swollen areas. If you have pain you can have tylenol. Return to the ED for any problems listed on the head injury sheet or if you have new pain.

## 2012-05-23 ENCOUNTER — Encounter (HOSPITAL_COMMUNITY): Payer: Self-pay | Admitting: Family Medicine

## 2012-05-23 ENCOUNTER — Emergency Department (HOSPITAL_COMMUNITY): Payer: Medicare Other

## 2012-05-23 ENCOUNTER — Emergency Department (HOSPITAL_COMMUNITY)
Admission: EM | Admit: 2012-05-23 | Discharge: 2012-05-23 | Disposition: A | Discharge status: Home / Self Care | Payer: Medicare Other | Attending: Emergency Medicine | Admitting: Emergency Medicine

## 2012-05-23 DIAGNOSIS — S0181XA Laceration without foreign body of other part of head, initial encounter: Secondary | ICD-10-CM

## 2012-05-23 DIAGNOSIS — Z593 Problems related to living in residential institution: Secondary | ICD-10-CM | POA: Insufficient documentation

## 2012-05-23 DIAGNOSIS — Y9389 Activity, other specified: Secondary | ICD-10-CM | POA: Insufficient documentation

## 2012-05-23 DIAGNOSIS — Y92009 Unspecified place in unspecified non-institutional (private) residence as the place of occurrence of the external cause: Secondary | ICD-10-CM | POA: Insufficient documentation

## 2012-05-23 DIAGNOSIS — Z7982 Long term (current) use of aspirin: Secondary | ICD-10-CM | POA: Insufficient documentation

## 2012-05-23 DIAGNOSIS — F209 Schizophrenia, unspecified: Secondary | ICD-10-CM | POA: Insufficient documentation

## 2012-05-23 DIAGNOSIS — S0003XA Contusion of scalp, initial encounter: Secondary | ICD-10-CM | POA: Insufficient documentation

## 2012-05-23 DIAGNOSIS — Z789 Other specified health status: Secondary | ICD-10-CM | POA: Insufficient documentation

## 2012-05-23 DIAGNOSIS — E119 Type 2 diabetes mellitus without complications: Secondary | ICD-10-CM | POA: Insufficient documentation

## 2012-05-23 DIAGNOSIS — S0083XA Contusion of other part of head, initial encounter: Secondary | ICD-10-CM

## 2012-05-23 DIAGNOSIS — W010XXA Fall on same level from slipping, tripping and stumbling without subsequent striking against object, initial encounter: Secondary | ICD-10-CM | POA: Insufficient documentation

## 2012-05-23 DIAGNOSIS — E785 Hyperlipidemia, unspecified: Secondary | ICD-10-CM | POA: Insufficient documentation

## 2012-05-23 DIAGNOSIS — Z79899 Other long term (current) drug therapy: Secondary | ICD-10-CM | POA: Insufficient documentation

## 2012-05-23 DIAGNOSIS — I10 Essential (primary) hypertension: Secondary | ICD-10-CM | POA: Insufficient documentation

## 2012-05-23 LAB — CBC WITH DIFFERENTIAL/PLATELET
Basophils Relative: 0 % (ref 0–1)
Eosinophils Absolute: 0.2 10*3/uL (ref 0.0–0.7)
Eosinophils Relative: 3 % (ref 0–5)
Lymphs Abs: 1.8 10*3/uL (ref 0.7–4.0)
MCH: 32.2 pg (ref 26.0–34.0)
MCHC: 32.9 g/dL (ref 30.0–36.0)
MCV: 98 fL (ref 78.0–100.0)
Neutrophils Relative %: 61 % (ref 43–77)
Platelets: 185 10*3/uL (ref 150–400)
RBC: 4.53 MIL/uL (ref 3.87–5.11)

## 2012-05-23 LAB — COMPREHENSIVE METABOLIC PANEL
ALT: 15 U/L (ref 0–35)
Albumin: 3.3 g/dL — ABNORMAL LOW (ref 3.5–5.2)
BUN: 11 mg/dL (ref 6–23)
Calcium: 9.7 mg/dL (ref 8.4–10.5)
GFR calc Af Amer: >90 mL/min (ref >90)
Glucose, Bld: 101 mg/dL — ABNORMAL HIGH (ref 70–99)
Sodium: 137 mEq/L (ref 135–145)
Total Protein: 6.6 g/dL (ref 6.0–8.3)

## 2012-05-23 LAB — GLUCOSE, CAPILLARY: Glucose-Capillary: 91 mg/dL (ref 70–99)

## 2012-05-23 NOTE — ED Notes (Signed)
ZOX:[id number]<AV> Expected date:05/23/12<BR> Expected time: 1:25 AM<BR> Means of arrival:Ambulance<BR> Comments:<BR> fall

## 2012-05-23 NOTE — ED Notes (Signed)
Per EMS, patient states that patient is from [facility] who was trying to get to her walker and fell striking her face on the nightstand. No LOC.

## 2012-05-23 NOTE — ED Notes (Signed)
Patient transported to X-ray 

## 2012-05-23 NOTE — ED Notes (Signed)
Wound to patient's right eye cleaned with peroxide and saline. Steri-strips applied. Ice Pack applied.

## 2012-05-23 NOTE — ED Notes (Signed)
EKG given to EDP, Lynelle Doctor, J,MD.

## 2012-05-23 NOTE — ED Notes (Signed)
Notified RN, Drenda Freeze CBG 91.

## 2012-05-23 NOTE — ED Provider Notes (Signed)
History    CSN: [id number] Arrival date & time 05/23/12  0138 First MD Initiated Contact with Patient 05/23/12 [phone]      Chief Complaint  Patient presents with  . Fall  . Facial Laceration    HPI Pt was trying to get to her walker when she stumbled and fell and hit her face this morning.  She denies LOC but did strike her head and her face sustaining a laceration and bruise around her left eye.  She denies any weakness or speech problems.  No nausea or vomiting.  No blurred vision.  The pain is mild to moderate.  It increases with palpation. Past Medical History  Diagnosis Date  . Hypertension   . Diabetes mellitus   . Schizophrenia   . Hyperlipemia     Past Surgical History  Procedure Date  . Cholecystectomy   . Abdominal hysterectomy     No family history on file.  History  Substance Use Topics  . Smoking status: Not on file  . Smokeless tobacco: Not on file  . Alcohol Use: No    OB History    Grav Para Term Preterm Abortions TAB SAB Ect Mult Living                  Review of Systems  Constitutional: Negative for fever.  Respiratory: Negative for chest tightness.   Gastrointestinal: Negative for vomiting and diarrhea.  All other systems reviewed and are negative.    Allergies  Review of patient's allergies indicates no known allergies.  Home Medications   Current Outpatient Rx  Name  Route  Sig  Dispense  Refill  . ASPIRIN EC 81 MG PO TBEC   Oral   Take 81 mg by mouth daily.         Marland Kitchen BENZTROPINE MESYLATE 1 MG PO TABS   Oral   Take 1 mg by mouth 2 (two) times daily.         Marland Kitchen DARIFENACIN HYDROBROMIDE ER 7.5 MG PO TB24   Oral   Take 7.5 mg by mouth daily.         . OMEGA-3 FATTY ACIDS 1000 MG PO CAPS   Oral   Take 2 g by mouth daily.         Marland Kitchen GLIMEPIRIDE 4 MG PO TABS   Oral   Take 4 mg by mouth daily before breakfast.         . LISINOPRIL 10 MG PO TABS   Oral   Take 10 mg by mouth daily.         Marland Kitchen LOPERAMIDE HCL 2 MG PO  CAPS   Oral   Take 2 mg by mouth 4 (four) times daily as needed. For diarhea         . PERPHENAZINE 8 MG PO TABS   Oral   Take 8 mg by mouth 2 (two) times daily.         Marland Kitchen PIOGLITAZONE HCL 30 MG PO TABS   Oral   Take 30 mg by mouth daily.         Marland Kitchen SIMVASTATIN 40 MG PO TABS   Oral   Take 40 mg by mouth every evening.         Marland Kitchen VITAMIN E 200 UNITS PO CAPS   Oral   Take 200 Units by mouth daily.           BP 133/79  Pulse 66  Temp 97.8 F (36.6 C) (Oral)  Resp  20  SpO2 93%  Physical Exam  Nursing note and vitals reviewed. Constitutional: She appears well-developed and well-nourished. No distress.  HENT:  Head: Normocephalic. Head is with contusion.    Right Ear: External ear normal.  Left Ear: External ear normal.       Superficial linear 3 cm laceration around nasal aspect left eye, contusion around left periorbital region, ttp infraorbital bone,  Eyes: Conjunctivae normal are normal. Right eye exhibits no discharge. Left eye exhibits no discharge. No scleral icterus.       Perrl, subconj hemorrhage left eye  Neck: Neck supple. No tracheal deviation present.  Cardiovascular: Normal rate, regular rhythm and intact distal pulses.   Pulmonary/Chest: Effort normal and breath sounds normal. No stridor. No respiratory distress. She has no wheezes. She has no rales.  Abdominal: Soft. Bowel sounds are normal. She exhibits no distension. There is no tenderness. There is no rebound and no guarding.  Musculoskeletal: She exhibits no edema and no tenderness.       Cervical back: Normal.       Thoracic back: Normal.       Lumbar back: Normal.  Neurological: She is alert. She has normal strength. No sensory deficit. Cranial nerve deficit:  no gross defecits noted. She exhibits normal muscle tone. She displays no seizure activity. Coordination normal.  Skin: Skin is warm and dry. No rash noted.  Psychiatric: She has a normal mood and affect.    ED Course  Procedures  (including critical care time)  Rate: 126  Rhythm: normal sinus rhythm  QRS Axis: left  Intervals: normal  ST/T Wave abnormalities: normal  Conduction Disutrbances:LAFB  Narrative Interpretation: lvh with repol abnormality  Old EKG Reviewed: none available  Labs Reviewed  COMPREHENSIVE METABOLIC PANEL - Abnormal; Notable for the following:    Glucose, Bld 101 (*)     Albumin 3.3 (*)     GFR calc non Af Amer 79 (*)     All other components within normal limits  CBC WITH DIFFERENTIAL  GLUCOSE, CAPILLARY  POCT CBG (FASTING - GLUCOSE)-MANUAL ENTRY   Dg Chest 2 View  05/23/2012  *RADIOLOGY REPORT*  Clinical Data: Status post fall; facial laceration.  Concern for chest injury.  CHEST - 2 VIEW  Comparison: Chest radiograph performed 12/02/2010  Findings: The lungs are relatively well-aerated.  There is stable elevation of the right hemidiaphragm.  Mild bibasilar airspace opacities likely reflect atelectasis.  Pulmonary vascularity is at the upper limits of normal.  No pleural effusion or pneumothorax is seen.  The heart is borderline normal in size; the mediastinal contour is within normal limits.  No acute osseous abnormalities are seen. Mild chronic wedge deformities are noted along the upper lumbar spine.  IMPRESSION: Mild bibasilar airspace opacities likely reflect atelectasis; stable elevation of the right hemidiaphragm.  No displaced rib fractures seen.   Original Report Authenticated By: Tonia Ghent, M.D.    Ct Head Wo Contrast  05/23/2012  *RADIOLOGY REPORT*  Clinical Data:  Status post fall; hit left side of face on nightstand.  CT HEAD WITHOUT CONTRAST CT MAXILLOFACIAL WITHOUT CONTRAST  Technique:  Multidetector CT imaging of the head and maxillofacial structures were performed using the standard protocol without intravenous contrast. Multiplanar CT image reconstructions of the maxillofacial structures were also generated.  Comparison:  CT of the head performed 10/29/2011, and CT of the  maxillofacial structures performed 12/12/2010  CT HEAD  Findings: There is no evidence of acute infarction, mass lesion, or intra- or extra-axial  hemorrhage on CT.  Prominence of the ventricles and sulci suggests mild cortical volume loss.  Scattered periventricular and subcortical white matter change likely reflects small vessel ischemic microangiopathy.  The brainstem and fourth ventricle are within normal limits.  The basal ganglia are unremarkable in appearance.  The cerebral hemispheres demonstrate grossly normal gray-white differentiation. No mass effect or midline shift is seen.  There is no evidence of fracture; visualized osseous structures are unremarkable in appearance.  The orbits are within normal limits. The paranasal sinuses and mastoid air cells are well-aerated. Prominent soft tissue swelling is noted overlying the left frontal calvarium, and surrounding the left orbit.  IMPRESSION:  1.  No evidence of traumatic intracranial injury or fracture. 2.  Prominent soft tissue swelling overlying the left frontal calvarium, and surrounding the left orbit. 3.  Mild cortical volume loss and scattered small vessel ischemic microangiopathy.  CT MAXILLOFACIAL  Findings:   There is no evidence of fracture or dislocation.  The maxilla and mandible appear intact.  The nasal bone is unremarkable in appearance.  There is chronic absence of multiple maxillary teeth; a relatively large periapical abscess is noted involving the right second mandibular incisor.  There is mild flattening of the mandibular condylar processes bilaterally.  The orbits are intact bilaterally.  The visualized paranasal sinuses and mastoid air cells are well-aerated.  There is prominent soft tissue swelling overlying the left frontal calvarium, and surrounding the left orbit.  The parapharyngeal fat planes are preserved.  The nasopharynx, oropharynx and hypopharynx are unremarkable in appearance.  The visualized portions of the valleculae and  piriform sinuses are grossly unremarkable.  There is calcification at the carotid bifurcations bilaterally, more prominent on the left, with likely mild left-sided carotid stenosis.  The parotid and submandibular glands are within normal limits.  No cervical lymphadenopathy is seen.  IMPRESSION:  1.  No evidence of fracture or dislocation. 2.  Prominent soft tissue swelling overlying the left frontal calvarium, and surrounding the left orbit. 3.  Relatively large periapical abscess involving the right second mandibular incisor, also present in 2012. 4.  Mild flattening of the mandibular condylar processes bilaterally, reflecting chronic temporomandibular joint disease. 5.  Calcification at the carotid bifurcations bilaterally, more prominent on the left, with likely mild left-sided carotid stenosis.  Carotid ultrasound would be helpful for further evaluation, when and as deemed clinically appropriate.   Original Report Authenticated By: Tonia Ghent, M.D.    Ct Maxillofacial Wo Cm  05/23/2012  *RADIOLOGY REPORT*  Clinical Data:  Status post fall; hit left side of face on nightstand.  CT HEAD WITHOUT CONTRAST CT MAXILLOFACIAL WITHOUT CONTRAST  Technique:  Multidetector CT imaging of the head and maxillofacial structures were performed using the standard protocol without intravenous contrast. Multiplanar CT image reconstructions of the maxillofacial structures were also generated.  Comparison:  CT of the head performed 10/29/2011, and CT of the maxillofacial structures performed 12/12/2010  CT HEAD  Findings: There is no evidence of acute infarction, mass lesion, or intra- or extra-axial hemorrhage on CT.  Prominence of the ventricles and sulci suggests mild cortical volume loss.  Scattered periventricular and subcortical white matter change likely reflects small vessel ischemic microangiopathy.  The brainstem and fourth ventricle are within normal limits.  The basal ganglia are unremarkable in appearance.  The  cerebral hemispheres demonstrate grossly normal gray-white differentiation. No mass effect or midline shift is seen.  There is no evidence of fracture; visualized osseous structures are unremarkable in appearance.  The orbits  are within normal limits. The paranasal sinuses and mastoid air cells are well-aerated. Prominent soft tissue swelling is noted overlying the left frontal calvarium, and surrounding the left orbit.  IMPRESSION:  1.  No evidence of traumatic intracranial injury or fracture. 2.  Prominent soft tissue swelling overlying the left frontal calvarium, and surrounding the left orbit. 3.  Mild cortical volume loss and scattered small vessel ischemic microangiopathy.  CT MAXILLOFACIAL  Findings:   There is no evidence of fracture or dislocation.  The maxilla and mandible appear intact.  The nasal bone is unremarkable in appearance.  There is chronic absence of multiple maxillary teeth; a relatively large periapical abscess is noted involving the right second mandibular incisor.  There is mild flattening of the mandibular condylar processes bilaterally.  The orbits are intact bilaterally.  The visualized paranasal sinuses and mastoid air cells are well-aerated.  There is prominent soft tissue swelling overlying the left frontal calvarium, and surrounding the left orbit.  The parapharyngeal fat planes are preserved.  The nasopharynx, oropharynx and hypopharynx are unremarkable in appearance.  The visualized portions of the valleculae and piriform sinuses are grossly unremarkable.  There is calcification at the carotid bifurcations bilaterally, more prominent on the left, with likely mild left-sided carotid stenosis.  The parotid and submandibular glands are within normal limits.  No cervical lymphadenopathy is seen.  IMPRESSION:  1.  No evidence of fracture or dislocation. 2.  Prominent soft tissue swelling overlying the left frontal calvarium, and surrounding the left orbit. 3.  Relatively large  periapical abscess involving the right second mandibular incisor, also present in 2012. 4.  Mild flattening of the mandibular condylar processes bilaterally, reflecting chronic temporomandibular joint disease. 5.  Calcification at the carotid bifurcations bilaterally, more prominent on the left, with likely mild left-sided carotid stenosis.  Carotid ultrasound would be helpful for further evaluation, when and as deemed clinically appropriate.   Original Report Authenticated By: Tonia Ghent, M.D.      1. Facial contusion   2. Laceration of face       MDM  No sign of fracture associated with her fall. Superficial laceration.  Will apply steri strips.  No need for suture.  No focal neurologic deficits.  Doubt stroke, TIA  Incidental findings noted.   Recc outpt follow up          Celene Kras, MD 05/23/12 [phone]

## 2012-05-23 NOTE — ED Notes (Signed)
[facility] contacted for transport back to [facility].

## 2012-08-04 ENCOUNTER — Emergency Department (HOSPITAL_COMMUNITY): Payer: Medicare Other

## 2012-08-04 ENCOUNTER — Inpatient Hospital Stay (HOSPITAL_COMMUNITY)
Admission: EM | Admit: 2012-08-04 | Discharge: 2012-08-07 | DRG: 637 | Disposition: A | Discharge status: Skilled Nursing Facility | Payer: Medicare Other | Attending: Internal Medicine | Admitting: Internal Medicine

## 2012-08-04 DIAGNOSIS — R451 Restlessness and agitation: Secondary | ICD-10-CM

## 2012-08-04 DIAGNOSIS — E119 Type 2 diabetes mellitus without complications: Secondary | ICD-10-CM

## 2012-08-04 DIAGNOSIS — N390 Urinary tract infection, site not specified: Secondary | ICD-10-CM

## 2012-08-04 DIAGNOSIS — E785 Hyperlipidemia, unspecified: Secondary | ICD-10-CM | POA: Diagnosis present

## 2012-08-04 DIAGNOSIS — F209 Schizophrenia, unspecified: Secondary | ICD-10-CM | POA: Diagnosis present

## 2012-08-04 DIAGNOSIS — E162 Hypoglycemia, unspecified: Secondary | ICD-10-CM

## 2012-08-04 DIAGNOSIS — E1169 Type 2 diabetes mellitus with other specified complication: Principal | ICD-10-CM | POA: Diagnosis present

## 2012-08-04 DIAGNOSIS — I10 Essential (primary) hypertension: Secondary | ICD-10-CM

## 2012-08-04 DIAGNOSIS — G9340 Encephalopathy, unspecified: Secondary | ICD-10-CM | POA: Diagnosis present

## 2012-08-04 LAB — GLUCOSE, CAPILLARY
Glucose-Capillary: 41 mg/dL — CL (ref 70–99)
Glucose-Capillary: 98 mg/dL (ref 70–99)

## 2012-08-04 LAB — COMPREHENSIVE METABOLIC PANEL
Albumin: 2.9 g/dL — ABNORMAL LOW (ref 3.5–5.2)
BUN: 14 mg/dL (ref 6–23)
Calcium: 8.9 mg/dL (ref 8.4–10.5)
Creatinine, Ser: 0.72 mg/dL (ref 0.50–1.10)
GFR calc Af Amer: >90 mL/min (ref >90)
Potassium: 3.5 mEq/L (ref 3.5–5.1)
Total Protein: 6.2 g/dL (ref 6.0–8.3)

## 2012-08-04 LAB — CBC WITH DIFFERENTIAL/PLATELET
Basophils Relative: 0 % (ref 0–1)
Eosinophils Absolute: 0.1 10*3/uL (ref 0.0–0.7)
Eosinophils Relative: 1 % (ref 0–5)
Hemoglobin: 14.2 g/dL (ref 12.0–15.0)
MCH: 32.9 pg (ref 26.0–34.0)
MCHC: 33.4 g/dL (ref 30.0–36.0)
MCV: 98.4 fL (ref 78.0–100.0)
Monocytes Relative: 10 % (ref 3–12)
Neutrophils Relative %: 79 % — ABNORMAL HIGH (ref 43–77)

## 2012-08-04 LAB — URINALYSIS, ROUTINE W REFLEX MICROSCOPIC
Bilirubin Urine: NEGATIVE
Ketones, ur: NEGATIVE mg/dL
Nitrite: NEGATIVE
Protein, ur: NEGATIVE mg/dL

## 2012-08-04 MED ORDER — DEXTROSE 50 % IV SOLN
25.0000 mL | Freq: Once | INTRAVENOUS | Status: AC
  Administered 2012-08-04: 25 mL via INTRAVENOUS

## 2012-08-04 MED ORDER — DEXTROSE 50 % IV SOLN: 25.0000 g | Freq: Once | INTRAVENOUS | Status: AC

## 2012-08-04 MED ORDER — SODIUM CHLORIDE 0.9 % IV BOLUS (SEPSIS): 1000.0000 mL | Freq: Once | INTRAVENOUS | Status: AC

## 2012-08-04 MED ORDER — DEXTROSE 5 % IV SOLN
1.0000 g | Freq: Once | INTRAVENOUS | Status: AC
  Administered 2012-08-05: 1 g via INTRAVENOUS
  Filled 2012-08-04: qty 10

## 2012-08-04 MED ORDER — DEXTROSE 50 % IV SOLN
INTRAVENOUS | Status: AC
  Administered 2012-08-04: 25 mL via INTRAVENOUS
  Filled 2012-08-04: qty 50

## 2012-08-04 NOTE — ED Provider Notes (Signed)
History     CSN: [id number]  Arrival date & time 08/04/12  1308   First MD Initiated Contact with Patient 08/04/12 2043      Chief Complaint  Patient presents with  . Agitation  . Hypoglycemia    (Consider location/radiation/quality/duration/timing/severity/associated sxs/prior treatment) HPI Level 5 caveat due to schizophrenia Pt brought to the ED via EMS from local [facility] where the patient is a resident due to agitation, found to have low blood sugar on EMS arrival. She was given D50 with improvement in CBG, has been calm and cooperative in the ED. CBG was low on arrival, improved again with D50 and given a meal to eat. Patient states she did not have dinner tonight.   Past Medical History  Diagnosis Date  . Hypertension   . Diabetes mellitus   . Schizophrenia   . Hyperlipemia     Past Surgical History  Procedure Laterality Date  . Cholecystectomy    . Abdominal hysterectomy      No family history on file.  History  Substance Use Topics  . Smoking status: Not on file  . Smokeless tobacco: Not on file  . Alcohol Use: No    OB History   Grav Para Term Preterm Abortions TAB SAB Ect Mult Living                  Review of Systems Unable to assess due to mental status.   Allergies  Review of patient's allergies indicates no known allergies.  Home Medications   Current Outpatient Rx  Name  Route  Sig  Dispense  Refill  . amantadine (SYMMETREL) 100 MG capsule   Oral   Take 100 mg by mouth daily.         Marland Kitchen aspirin EC 81 MG tablet   Oral   Take 81 mg by mouth daily.         Marland Kitchen atorvastatin (LIPITOR) 20 MG tablet   Oral   Take 20 mg by mouth at bedtime.         Marland Kitchen Besifloxacin HCl (BESIVANCE) 0.6 % SUSP   Ophthalmic   Apply to eye. Instill 1 gtt od twice daily x21 days after surgery         . calcium-vitamin D (OSCAL 500/200 D-3) 500-200 MG-UNIT per tablet   Oral   Take 1 tablet by mouth 2 (two) times daily.         Marland Kitchen darifenacin  (ENABLEX) 7.5 MG 24 hr tablet   Oral   Take 7.5 mg by mouth every morning.          . Difluprednate (DUREZOL) 0.05 % EMUL   Ophthalmic   Apply 1 drop to eye. Instill 1 gtt od bid x 2 weeks after surgery then decrease to qd until disc.         . fish oil-omega-3 fatty acids 1000 MG capsule   Oral   Take 1 g by mouth 2 (two) times daily.          Marland Kitchen glimepiride (AMARYL) 4 MG tablet   Oral   Take 4 mg by mouth every morning.          Marland Kitchen lisinopril (PRINIVIL,ZESTRIL) 10 MG tablet   Oral   Take 10 mg by mouth daily.         Marland Kitchen loperamide (IMODIUM) 2 MG capsule   Oral   Take 2 mg by mouth 4 (four) times daily as needed. For diarhea         .  Nepafenac (ILEVRO) 0.3 % SUSP   Ophthalmic   Apply 1 drop to eye. Instill 1 gtt od in the morning         . perphenazine (TRILAFON) 8 MG tablet   Oral   Take 8-40 mg by mouth 3 (three) times daily. Take 1 tablet at 8am, 1 tablet at 5pm, and 5 tablets at bedtime         . pioglitazone (ACTOS) 30 MG tablet   Oral   Take 30 mg by mouth every morning.            BP 136/83  Pulse 93  Temp(Src) 97.9 F (36.6 C) (Oral)  Resp 14  SpO2 92%  Physical Exam  Nursing note and vitals reviewed. Constitutional: She appears well-developed and well-nourished.  HENT:  Head: Normocephalic and atraumatic.  Eyes: EOM are normal. Pupils are equal, round, and reactive to light.  Neck: Normal range of motion. Neck supple.  Cardiovascular: Normal rate, normal heart sounds and intact distal pulses.   Pulmonary/Chest: Effort normal and breath sounds normal.  Abdominal: Bowel sounds are normal. She exhibits no distension. There is no tenderness.  Musculoskeletal: Normal range of motion. She exhibits no edema and no tenderness.  Neurological: She is alert. She has normal strength. No cranial nerve deficit or sensory deficit.  Skin: Skin is warm and dry. No rash noted.  Psychiatric: She has a normal mood and affect.    ED Course   Procedures (including critical care time)  Labs Reviewed  CBC WITH DIFFERENTIAL - Abnormal; Notable for the following:    Neutrophils Relative 79 (*)    Neutro Abs 7.8 (*)    Lymphocytes Relative 10 (*)    All other components within normal limits  COMPREHENSIVE METABOLIC PANEL - Abnormal; Notable for the following:    Glucose, Bld 45 (*)    Albumin 2.9 (*)    Total Bilirubin 0.2 (*)    GFR calc non Af Amer 82 (*)    All other components within normal limits  GLUCOSE, CAPILLARY - Abnormal; Notable for the following:    Glucose-Capillary 41 (*)    All other components within normal limits  URINALYSIS, ROUTINE W REFLEX MICROSCOPIC - Abnormal; Notable for the following:    Hgb urine dipstick MODERATE (*)    Leukocytes, UA LARGE (*)    All other components within normal limits  GLUCOSE, CAPILLARY - Abnormal; Notable for the following:    Glucose-Capillary 62 (*)    All other components within normal limits  GLUCOSE, CAPILLARY  URINE MICROSCOPIC-ADD ON   Dg Chest 2 View  08/04/2012  *RADIOLOGY REPORT*  Clinical Data: Hypoglycemia.  Lethargy.  Short of breath.  CHEST - 2 VIEW  Comparison: 05/23/2012.12/02/2010 chest radiograph.  Findings: Low volume chest.  Basilar atelectasis.  Increased density is present over the lower lobes on the lateral view however this is favored to be due to atelectasis rather than pneumonia/airspace disease.  The appearance is similar to a low volume lateral radiograph of 12/02/2010. Cardiopericardial silhouette appears mildly enlarged, even allowing for low volumes. Low volumes accentuate the tortuous thoracic aorta.  IMPRESSION: Low volume chest with bilateral basilar atelectasis.   Original Report Authenticated By: Andreas Newport, M.D.      No diagnosis found.    MDM  UA shows UTI as likely source of her confusion and possibly also contributing to her transient hypoglycemia. CBG has dropped again here into the 60s, will give dose of Rocephin and plan to  admit  for further eval.         Leonette Most B. Bernette Mayers, MD 08/04/12 1610

## 2012-08-04 NOTE — ED Notes (Signed)
Per EMS - Pt from [facility], [place] called d/t noted to have increased agitation onset yesterday. Pt fell out of bed PTA of EMS sustaining abrasions to both knees. BP - 118/70, HR - 80, Resp -18, O2 SAT 90, placed on oxygen 2L/min. Initial CBG found to be 35. Following 25 Gm D50, #20 left forearm w/ NS 1000

## 2012-08-04 NOTE — ED Notes (Signed)
Bed:WHALA<BR> Expected date:08/04/12<BR> Expected time: 6:19 PM<BR> Means of arrival:<BR> Comments:<BR> Fall, skinned knees

## 2012-08-05 ENCOUNTER — Encounter (HOSPITAL_COMMUNITY): Payer: Self-pay

## 2012-08-05 DIAGNOSIS — IMO0002 Reserved for concepts with insufficient information to code with codable children: Secondary | ICD-10-CM

## 2012-08-05 DIAGNOSIS — R451 Restlessness and agitation: Secondary | ICD-10-CM | POA: Diagnosis present

## 2012-08-05 DIAGNOSIS — E162 Hypoglycemia, unspecified: Secondary | ICD-10-CM

## 2012-08-05 DIAGNOSIS — I10 Essential (primary) hypertension: Secondary | ICD-10-CM | POA: Diagnosis present

## 2012-08-05 DIAGNOSIS — E119 Type 2 diabetes mellitus without complications: Secondary | ICD-10-CM

## 2012-08-05 DIAGNOSIS — N390 Urinary tract infection, site not specified: Secondary | ICD-10-CM

## 2012-08-05 LAB — COMPREHENSIVE METABOLIC PANEL
AST: 24 U/L (ref 0–37)
Albumin: 2.6 g/dL — ABNORMAL LOW (ref 3.5–5.2)
BUN: 11 mg/dL (ref 6–23)
Calcium: 8.6 mg/dL (ref 8.4–10.5)
Chloride: 105 mEq/L (ref 96–112)
Creatinine, Ser: 0.66 mg/dL (ref 0.50–1.10)
Total Bilirubin: 0.2 mg/dL — ABNORMAL LOW (ref 0.3–1.2)

## 2012-08-05 LAB — CBC
Hemoglobin: 12.6 g/dL (ref 12.0–15.0)
MCH: 31.9 pg (ref 26.0–34.0)
MCV: 99.7 fL (ref 78.0–100.0)
Platelets: 146 10*3/uL — ABNORMAL LOW (ref 150–400)
RBC: 3.95 MIL/uL (ref 3.87–5.11)
WBC: 7 10*3/uL (ref 4.0–10.5)

## 2012-08-05 LAB — GLUCOSE, CAPILLARY
Glucose-Capillary: 131 mg/dL — ABNORMAL HIGH (ref 70–99)
Glucose-Capillary: 135 mg/dL — ABNORMAL HIGH (ref 70–99)
Glucose-Capillary: 137 mg/dL — ABNORMAL HIGH (ref 70–99)
Glucose-Capillary: 152 mg/dL — ABNORMAL HIGH (ref 70–99)
Glucose-Capillary: 209 mg/dL — ABNORMAL HIGH (ref 70–99)

## 2012-08-05 LAB — HEMOGLOBIN A1C: Hgb A1c MFr Bld: 5.4 % (ref <5.7)

## 2012-08-05 MED ORDER — PERPHENAZINE 8 MG PO TABS
8.0000 mg | ORAL_TABLET | Freq: Two times a day (BID) | ORAL | Status: DC
  Administered 2012-08-05 – 2012-08-07 (×5): 8 mg via ORAL
  Filled 2012-08-05 (×8): qty 1

## 2012-08-05 MED ORDER — DEXTROSE 5 % IV SOLN
1.0000 g | INTRAVENOUS | Status: DC
  Administered 2012-08-06 – 2012-08-07 (×2): 1 g via INTRAVENOUS
  Filled 2012-08-05 (×2): qty 10

## 2012-08-05 MED ORDER — OMEGA-3-ACID ETHYL ESTERS 1 G PO CAPS
1.0000 g | ORAL_CAPSULE | Freq: Two times a day (BID) | ORAL | Status: DC
  Administered 2012-08-05 – 2012-08-07 (×5): 1 g via ORAL
  Filled 2012-08-05 (×6): qty 1

## 2012-08-05 MED ORDER — DARIFENACIN HYDROBROMIDE ER 7.5 MG PO TB24
7.5000 mg | ORAL_TABLET | Freq: Every morning | ORAL | Status: DC
  Administered 2012-08-05 – 2012-08-07 (×3): 7.5 mg via ORAL
  Filled 2012-08-05 (×3): qty 1

## 2012-08-05 MED ORDER — HYDROCODONE-ACETAMINOPHEN 5-325 MG PO TABS: 1.0000 | ORAL_TABLET | ORAL | Status: DC | PRN

## 2012-08-05 MED ORDER — OMEGA-3 FATTY ACIDS 1000 MG PO CAPS: 1.0000 g | ORAL_CAPSULE | Freq: Two times a day (BID) | ORAL | Status: DC

## 2012-08-05 MED ORDER — PNEUMOCOCCAL VAC POLYVALENT 25 MCG/0.5ML IJ INJ
0.5000 mL | INJECTION | INTRAMUSCULAR | Status: AC
  Administered 2012-08-06: 0.5 mL via INTRAMUSCULAR
  Filled 2012-08-05 (×2): qty 0.5

## 2012-08-05 MED ORDER — ONDANSETRON HCL 4 MG/2ML IJ SOLN: 4.0000 mg | Freq: Four times a day (QID) | INTRAMUSCULAR | Status: DC | PRN

## 2012-08-05 MED ORDER — INFLUENZA VIRUS VACC SPLIT PF IM SUSP
0.5000 mL | INTRAMUSCULAR | Status: AC
  Administered 2012-08-06: 0.5 mL via INTRAMUSCULAR
  Filled 2012-08-05 (×2): qty 0.5

## 2012-08-05 MED ORDER — NEPAFENAC 0.3 % OP SUSP: 1.0000 [drp] | Freq: Every day | OPHTHALMIC | Status: DC

## 2012-08-05 MED ORDER — CALCIUM CARBONATE-VITAMIN D 500-200 MG-UNIT PO TABS
1.0000 | ORAL_TABLET | Freq: Two times a day (BID) | ORAL | Status: DC
  Administered 2012-08-05 – 2012-08-07 (×5): 1 via ORAL
  Filled 2012-08-05 (×6): qty 1

## 2012-08-05 MED ORDER — AMANTADINE HCL 100 MG PO CAPS
100.0000 mg | ORAL_CAPSULE | Freq: Every day | ORAL | Status: DC
  Administered 2012-08-05 – 2012-08-07 (×3): 100 mg via ORAL
  Filled 2012-08-05 (×3): qty 1

## 2012-08-05 MED ORDER — DEXTROSE-NACL 5-0.45 % IV SOLN
INTRAVENOUS | Status: AC
  Administered 2012-08-05: 02:00:00 via INTRAVENOUS

## 2012-08-05 MED ORDER — DEXTROSE 50 % IV SOLN
25.0000 g | Freq: Once | INTRAVENOUS | Status: AC
  Administered 2012-08-05: 25 g via INTRAVENOUS
  Filled 2012-08-05: qty 50

## 2012-08-05 MED ORDER — LISINOPRIL 10 MG PO TABS
10.0000 mg | ORAL_TABLET | Freq: Every day | ORAL | Status: DC
  Administered 2012-08-05 – 2012-08-07 (×3): 10 mg via ORAL
  Filled 2012-08-05 (×3): qty 1

## 2012-08-05 MED ORDER — ASPIRIN EC 81 MG PO TBEC
81.0000 mg | DELAYED_RELEASE_TABLET | Freq: Every day | ORAL | Status: DC
  Administered 2012-08-05 – 2012-08-07 (×3): 81 mg via ORAL
  Filled 2012-08-05 (×3): qty 1

## 2012-08-05 MED ORDER — BIOTENE DRY MOUTH MT LIQD
15.0000 mL | Freq: Two times a day (BID) | OROMUCOSAL | Status: DC
  Administered 2012-08-05 – 2012-08-07 (×5): 15 mL via OROMUCOSAL

## 2012-08-05 MED ORDER — PERPHENAZINE 8 MG PO TABS
40.0000 mg | ORAL_TABLET | Freq: Every day | ORAL | Status: DC
  Administered 2012-08-05 – 2012-08-06 (×3): 40 mg via ORAL
  Filled 2012-08-05 (×4): qty 1

## 2012-08-05 MED ORDER — SODIUM CHLORIDE 0.9 % IV SOLN
INTRAVENOUS | Status: DC
  Administered 2012-08-05: 14:00:00 via INTRAVENOUS

## 2012-08-05 MED ORDER — LOPERAMIDE HCL 2 MG PO CAPS: 2.0000 mg | ORAL_CAPSULE | Freq: Four times a day (QID) | ORAL | Status: DC | PRN

## 2012-08-05 MED ORDER — ONDANSETRON HCL 4 MG PO TABS: 4.0000 mg | ORAL_TABLET | Freq: Four times a day (QID) | ORAL | Status: DC | PRN

## 2012-08-05 MED ORDER — ATORVASTATIN CALCIUM 20 MG PO TABS
20.0000 mg | ORAL_TABLET | Freq: Every day | ORAL | Status: DC
  Administered 2012-08-05 – 2012-08-06 (×3): 20 mg via ORAL
  Filled 2012-08-05 (×4): qty 1

## 2012-08-05 MED ORDER — PERPHENAZINE 8 MG PO TABS: 8.0000 mg | ORAL_TABLET | Freq: Three times a day (TID) | ORAL | Status: DC

## 2012-08-05 MED ORDER — DIFLUPREDNATE 0.05 % OP EMUL: 1.0000 [drp] | Freq: Two times a day (BID) | OPHTHALMIC | Status: DC

## 2012-08-05 NOTE — H&P (Signed)
[facility] History and Physical  [name] ZOX:[id number] DOB: 1934/07/10 DOA: 08/04/2012  Referring physician: ER physician PCP: Leanor Rubenstein, MD   Chief Complaint: hypoglycemia  HPI:  78 year old female with past medical history of HTN, DM, dyslipidemia, schizophrenia from nursing home who brought in after having an episode of agitation and lowe blood glucose. Patient is not a good historian due to mental status changes. Per ED report CBG recovered to WNL after D 50. In ED, blood glucose again on low side but improved with D 50.41, 98, 62 In ED, evaluation also included CT head which was essentially unremarkable. CXR showed bibasilar atelectasis. BMP and CBC unrevealing. Urinalysis showed large leukocyte esterases.  Assessment and Plan:  Principal Problem: *Acute encephalopathy, agitation  Likely due to combination of hypoglycemia and  UTI  Avoid home meds: actos and Amaryl  Started rocephin for possible UTI.  Active Problems:   HTN (hypertension)  Continue  home meds: lisinopril   Diabetes  Hold amaryl and actos   UTI (lower urinary tract infection)  Started rocephin 1 gm daily   Manson Passey [facility] [phone]  Review of Systems:  Unable to provide due to altered mental status  Past Medical History  Diagnosis Date  . Hypertension   . Diabetes mellitus   . Schizophrenia   . Hyperlipemia    Past Surgical History  Procedure Laterality Date  . Cholecystectomy    . Abdominal hysterectomy     Social History:  reports that she does not drink alcohol or use illicit drugs. Her tobacco history is not on file.  No Known Allergies  Family History: heart disease in parents   Prior to Admission medications   Medication Sig Start Date End Date Taking? Authorizing Provider  amantadine (SYMMETREL) 100 MG capsule Take 100 mg by mouth daily.   Yes Historical Provider, MD  aspirin EC 81 MG tablet Take 81 mg by mouth daily.   Yes Historical Provider, MD   atorvastatin (LIPITOR) 20 MG tablet Take 20 mg by mouth at bedtime.   Yes Historical Provider, MD  Besifloxacin HCl (BESIVANCE) 0.6 % SUSP Apply to eye. Instill 1 gtt od twice daily x21 days after surgery   Yes Historical Provider, MD  calcium-vitamin D (OSCAL 500/200 D-3) 500-200 MG-UNIT per tablet Take 1 tablet by mouth 2 (two) times daily.   Yes Historical Provider, MD  darifenacin (ENABLEX) 7.5 MG 24 hr tablet Take 7.5 mg by mouth every morning.    Yes Historical Provider, MD  Difluprednate (DUREZOL) 0.05 % EMUL Apply 1 drop to eye. Instill 1 gtt od bid x 2 weeks after surgery then decrease to qd until disc.   Yes Historical Provider, MD  fish oil-omega-3 fatty acids 1000 MG capsule Take 1 g by mouth 2 (two) times daily.    Yes Historical Provider, MD  glimepiride (AMARYL) 4 MG tablet Take 4 mg by mouth every morning.    Yes Historical Provider, MD  lisinopril (PRINIVIL,ZESTRIL) 10 MG tablet Take 10 mg by mouth daily.   Yes Historical Provider, MD  loperamide (IMODIUM) 2 MG capsule Take 2 mg by mouth 4 (four) times daily as needed. For diarhea   Yes Historical Provider, MD  Nepafenac (ILEVRO) 0.3 % SUSP Apply 1 drop to eye. Instill 1 gtt od in the morning   Yes Historical Provider, MD  perphenazine (TRILAFON) 8 MG tablet Take 8-40 mg by mouth 3 (three) times daily. Take 1 tablet at 8am, 1 tablet at 5pm, and 5 tablets  at bedtime   Yes Historical Provider, MD  pioglitazone (ACTOS) 30 MG tablet Take 30 mg by mouth every morning.    Yes Historical Provider, MD   Physical Exam: Filed Vitals:   08/04/12 1836 08/04/12 1900 08/04/12 2341  BP: 139/77 136/83 152/91  Pulse: 99 93 64  Temp: 97.9 F (36.6 C)  98.3 F (36.8 C)  TempSrc: Oral  Oral  Resp: 14  20  SpO2: 93% 92% 94%    Physical Exam  Constitutional: Appears in. No distress.  HENT: Normocephalic. Moist oral mucosa  Eyes: Conjunctivae and EOM are normal. PERRLA, no scleral icterus.  Neck: Normal ROM. Neck supple. No JVD. No tracheal  deviation. No thyromegaly.  CVS: RRR, S1/S2 +, no murmurs, no gallops, no carotid bruit.  Pulmonary: Effort and breath sounds normal, no stridor, rhonchi, wheezes, rales.  Abdominal: Soft. BS +,  no distension, tenderness, rebound or guarding.  Musculoskeletal: Normal range of motion. No edema and no tenderness.  Lymphadenopathy: No lymphadenopathy noted, cervical, inguinal. Neuro: Alert. Normal reflexes, muscle tone coordination. No cranial nerve deficit. Skin: Skin is warm and dry. No rash noted. Not diaphoretic. No erythema. No pallor.   Labs on Admission:  Basic Metabolic Panel:  Recent Labs Lab 08/04/12 1916  NA 138  K 3.5  CL 102  CO2 28  GLUCOSE 45*  BUN 14  CREATININE 0.72  CALCIUM 8.9   Liver Function Tests:  Recent Labs Lab 08/04/12 1916  AST 23  ALT 15  ALKPHOS 57  BILITOT 0.2*  PROT 6.2  ALBUMIN 2.9*   No results found for this basename: LIPASE, AMYLASE,  in the last 168 hours No results found for this basename: AMMONIA,  in the last 168 hours CBC:  Recent Labs Lab 08/04/12 1916  WBC 9.8  NEUTROABS 7.8*  HGB 14.2  HCT 42.5  MCV 98.4  PLT 160   Cardiac Enzymes: No results found for this basename: CKTOTAL, CKMB, CKMBINDEX, TROPONINI,  in the last 168 hours BNP: No components found with this basename: POCBNP,  CBG:  Recent Labs Lab 08/04/12 1924 08/04/12 2037 08/04/12 2347  GLUCAP 41* 98 62*    Radiological Exams on Admission: Dg Chest 2 View 08/04/2012  * IMPRESSION: Low volume chest with bilateral basilar atelectasis.   Original Report Authenticated By: Andreas Newport, M.D.    Ct Head Wo Contrast 08/04/2012  * IMPRESSION:  1.  No acute intracranial abnormality. 2.  Stable mild atrophy and small vessel ischemic white matter changes. 3.  Intracranial atherosclerosis.   Original Report Authenticated By: Malachy Moan, M.D.     EKG: Normal sinus rhythm, no ST/T wave changes  Code Status: Full Family Communication: Pt at  bedside Disposition Plan: Admit for further evaluation; observation  Manson Passey, MD  [facility] Pager [phone]  If 7PM-7AM, please contact night-coverage [email or web address] Password [facility] 08/05/2012, 12:47 AM

## 2012-08-05 NOTE — Progress Notes (Signed)
Utilization review completed.  

## 2012-08-05 NOTE — Progress Notes (Signed)
I have seen and assessed the patient and agree with Dr Armando Gang assessment and plan. Check Urine culture. Continue to hold actos and amaryl. May need glucophage instead on discharge depending on renal function. Will check Hgb A1C. Follow.

## 2012-08-05 NOTE — Progress Notes (Signed)
Inpatient Diabetes Program Recommendations  AACE/ADA: New Consensus Statement on Inpatient Glycemic Control (2013)  Target Ranges:  Prepandial:   less than 140 mg/dL      Peak postprandial:   less than 180 mg/dL (1-2 hours)      Critically ill patients:  140 - 180 mg/dL   Reason for Visit: Hypo and Hyperglycemia  Results for AMYJO, MIZRACHI (MRN [id number]) as of 08/05/2012 12:40  Ref. Range 08/05/2012 00:46 08/05/2012 02:08 08/05/2012 05:53 08/05/2012 07:46 08/05/2012 09:54  Glucose-Capillary Latest Range: 70-99 mg/dL 66 (L) 147 (H) 829 (H) 152 (H) 137 (H)    Inpatient Diabetes Program Recommendations Correction (SSI): Add Novolog sensitive tidwc Oral Agents: Restart OHAs at discharge HgbA1C: Check HgbA1C to assess glycemic control prior to hospitalization Diet: Change diet to CHO mod medium  Note: Will follow.  Thank you. Ailene Ards, RD, LDN, CDE Inpatient Diabetes Coordinator [phone]

## 2012-08-05 NOTE — Progress Notes (Signed)
Clinical Social Work Department BRIEF PSYCHOSOCIAL ASSESSMENT 08/05/2012  Patient:  [name],[name]     Account Number:  [id number]     Admit date:  08/04/2012  Clinical Social Worker:  Jacelyn Grip  Date/Time:  08/05/2012 03:00 PM  Referred by:  Physician  Date Referred:  08/05/2012 Referred for  ALF Placement   Other Referral:   Interview type:  Patient Other interview type:   and patient ALF facility    PSYCHOSOCIAL DATA Living Status:  FACILITY Admitted from facility:  [facility] Level of care:  Assisted Living Primary support name:  Reuel Boom Hassell/son Primary support relationship to patient:  CHILD, ADULT Degree of support available:   unknown at this time, no family present at bedside    CURRENT CONCERNS Current Concerns  Post-Acute Placement   Other Concerns:    SOCIAL WORK ASSESSMENT / PLAN CSW received notification that pt admitted from [facility].    CSW met with pt at bedside to discuss. Pt displayed confusion stating that she lives at the [facility]. Per chart, pt experiencing encephalopathy due to hypoglycemia and UTI.    CSW spoke with [facility] who confirmed pt is a resident there. CSW discussed awaiting PT/OT evaluation and then will continue to discuss with [facility] to ensure that facility can meet pt needs. Facility reports that if pt is at baseline, [facility] will likely be able to continue to meet pt needs.    CSW to follow up following PT/OT evaluations and continue to assist with pt discharge planning needs.   Assessment/plan status:  Psychosocial Support/Ongoing Assessment of Needs Other assessment/ plan:   Information/referral to community resources:   Discussion with [facility] re: pt returning to ALF    PATIENT'S/FAMILY'S RESPONSE TO PLAN OF CARE: Pt alert and pleasant, but displayed confusion about currently living at ALF. Pt was able to tell CSW that she needed assistance with  her depins and CSW notified RN. Pt appreciative of CSW visit.      Jacklynn Lewis, MSW, LCSWA  Clinical Social Work [phone]

## 2012-08-06 ENCOUNTER — Encounter (HOSPITAL_COMMUNITY): Payer: Self-pay | Admitting: Oncology

## 2012-08-06 LAB — GLUCOSE, CAPILLARY
Glucose-Capillary: 111 mg/dL — ABNORMAL HIGH (ref 70–99)
Glucose-Capillary: 133 mg/dL — ABNORMAL HIGH (ref 70–99)
Glucose-Capillary: 106 mg/dL — ABNORMAL HIGH (ref 70–99)

## 2012-08-06 LAB — CBC
Hemoglobin: 12.4 g/dL (ref 12.0–15.0)
MCH: 32.8 pg (ref 26.0–34.0)
MCHC: 32.8 g/dL (ref 30.0–36.0)
MCV: 100 fL (ref 78.0–100.0)
RBC: 3.78 MIL/uL — ABNORMAL LOW (ref 3.87–5.11)

## 2012-08-06 LAB — BASIC METABOLIC PANEL
BUN: 13 mg/dL (ref 6–23)
CO2: 30 mEq/L (ref 19–32)
Calcium: 8.4 mg/dL (ref 8.4–10.5)
Creatinine, Ser: 0.67 mg/dL (ref 0.50–1.10)
GFR calc non Af Amer: 84 mL/min — ABNORMAL LOW (ref >90)
Glucose, Bld: 112 mg/dL — ABNORMAL HIGH (ref 70–99)

## 2012-08-06 NOTE — Clinical Documentation Improvement (Signed)
DIABETIC  DOCUMENTATION CLARIFICATION QUERY  THIS DOCUMENT IS NOT A PERMANENT PART OF THE MEDICAL RECORD  TO RESPOND TO THE THIS QUERY, FOLLOW THE INSTRUCTIONS BELOW:  1. If needed, update documentation for the patient's encounter via the notes activity.  2. Access this query again and click edit on the In [employer].  3. After updating, or not, click F2 to complete all highlighted (required) fields concerning your review. Select "additional documentation in the medical record" OR "no additional documentation provided".  4. Click Sign note button.  5. The deficiency will fall out of your In Basket *Please let us know if you are not able to complete this workflow by phone or e-mail (listed below).  Please update your documentation within the medical record to reflect your response to this query.                                                                                        08/06/12   Dear Dr. Thompson:/Associates,  In a better effort to capture your patient's severity of illness, reflect appropriate length of stay and utilization of resources, a review of the patient medical record has revealed the following indicators.    Based on your clinical judgment, please clarify and document in a progress note and/or discharge summary the clinical condition associated with the following supporting information:  In responding to this query please exercise your independent judgment.  The fact that a query is asked, does not imply that any particular answer is desired or expected.  Please clarify and specify Diabetes type, control, manifestations, and associated conditions.  Possible Clinical Conditions?   _______Diabetes Type  1 or 2 _______Controlled or uncontrolled  Manifestations:  _______DM retinopathy  _______DM PVD _______DM neuropathy   _______DM nephropathy  Associated conditions: _______DM cellulitis _______DM gangrene _______DM gastroparesis _______DM  hyperosmolarity state _______DM ketoacidosis with or without coma _______DM osteomyelitis _______DM skin ulcer  _______Other Condition _______Cannot Clinically determine     Supporting Information: Risk Factors:  PMH: Hypertension    .  Diabetes mellitus    .  Hyperlipemia  . UTI  Signs & Symptoms: H&P: from nursing home who brought in after having an episode of agitation and lower blood glucose   Diabetes-Hold amaryl and actos  Lab: Hgb A1c level: 5.4   Blood glucose range : 08/04/12 > 45                                     08/05/12 >  range 131-202                                     08/06/12 >  range 111-133   Treatment:Hold amaryl and actos                   IVF D50 in ED   transitioned to IVF NS@75ml /hr          Consult:Diabetes Coordinator 08/05/12    You may use possible, probable, or suspect with  inpatient documentation. possible, probable, suspected diagnoses MUST be documented at the time of discharge  Reviewed: additional documentation in the medical record  Thank Barrie Dunker  Clinical Documentation Specialist:  Pager [phone]  Health Information Management [facility]

## 2012-08-06 NOTE — Evaluation (Signed)
Physical Therapy Evaluation Patient Details Name: [name] MRN: [id number] DOB: May 04, 1935 Today's Date: 08/06/2012 Time: 4098-1191 PT Time Calculation (min): 25 min  PT Assessment / Plan / Recommendation Clinical Impression  78 yo female admitted from ALF with AMS and hypoglycemia.  Pt was able to cooperate with PT today for eval and bed mobility, transfer to [facility] and chair, exercise and ambulation short distance in hallway.  Feel she is close to baseline and should be able to return to ALF with HHPT to address her deconditioing    PT Assessment  Patient needs continued PT services    Follow Up Recommendations  Home health PT    Does the patient have the potential to tolerate intense rehabilitation      Barriers to Discharge        Equipment Recommendations  None recommended by PT    Recommendations for Other Services     Frequency Min 3X/week    Precautions / Restrictions Precautions Precautions: Fall Precaution Comments: Pt with dressings on both knees.  She reports she fell prior to admission Restrictions Weight Bearing Restrictions: No   Pertinent Vitals/Pain No c/o pain      Mobility  Bed Mobility Bed Mobility: Right Sidelying to Sit Rolling Right: 4: Min assist;With rail Rolling Left: 3: Mod assist;With rail Right Sidelying to Sit: 3: Mod assist;HOB elevated Supine to Sit: 2: Max assist Details for Bed Mobility Assistance: head of bed raised to max amount to assist with bringing upper body up into sitting.  Pt pleased with her ability to do better this session Transfers Transfers: Sit to Stand;Stand to Sit Sit to Stand: 4: Min assist Stand to Sit: 4: Min assist Details for Transfer Assistance: needs consistent cues to push up with arms Ambulation/Gait Ambulation/Gait Assistance: 4: Min assist Ambulation Distance (Feet): 40 Feet Assistive device: Rolling walker Gait Pattern: Step-through pattern;Trunk flexed Gait velocity: decreased General Gait  Details: pt with deconditioning evident.  she needs encouragement Stairs: No    Exercises General Exercises - Upper Extremity Shoulder Flexion: AROM;Both;5 reps;Supine General Exercises - Lower Extremity Ankle Circles/Pumps: AROM;5 reps;Supine;Both Hip Flexion/Marching: AROM;Both;5 reps;Supine Other Exercises Other Exercises: repeated sit to stand and standing balance with RW   PT Diagnosis: Difficulty walking;Generalized weakness  PT Problem List: Decreased strength;Decreased activity tolerance;Decreased mobility PT Treatment Interventions: Gait training;Functional mobility training;Therapeutic activities;Therapeutic exercise;Patient/family education   PT Goals Acute Rehab PT Goals PT Goal Formulation: With patient Time For Goal Achievement: 08/20/12 Potential to Achieve Goals: Good Pt will go Supine/Side to Sit: Independently PT Goal: Supine/Side to Sit - Progress: Goal set today Pt will go Sit to Supine/Side: Independently PT Goal: Sit to Supine/Side - Progress: Goal set today Pt will go Sit to Stand: Independently PT Goal: Sit to Stand - Progress: Goal set today Pt will go Stand to Sit: Independently PT Goal: Stand to Sit - Progress: Goal set today Pt will Ambulate: >150 feet;with modified independence;with rolling walker PT Goal: Ambulate - Progress: Goal set today  Visit Information  Last PT Received On: 08/06/12    Subjective Data  Subjective: "It feels so good to get up" Patient Stated Goal: to get HHPT at [facility]   Prior Functioning  Home Living Type of Home: Assisted living Home Adaptive Equipment: Walker - rolling Additional Comments: from [facility] Prior Function Level of Independence: Independent with assistive device(s) Comments: pt states she can walk "a little bit" with the walker Communication Communication: No difficulties Dominant Hand: Right    Cognition  Cognition Overall Cognitive Status: Appears within functional limits for tasks  assessed/performed Area of Impairment: Attention;Following commands;Safety/judgement Arousal/Alertness: Awake/alert Orientation Level: Disoriented to;Place;Time;Situation Behavior During Session: [facility] for tasks performed Current Attention Level: Sustained Following Commands: Follows one step commands consistently Safety/Judgement: Decreased awareness of safety precautions    Extremity/Trunk Assessment Right Upper Extremity Assessment RUE ROM/Strength/Tone: Within functional levels Left Upper Extremity Assessment LUE ROM/Strength/Tone: Within functional levels Right Lower Extremity Assessment RLE ROM/Strength/Tone: Deficits RLE ROM/Strength/Tone Deficits: pt with 2 allevyn dressings on knee.  She has generalized muscle atrophy Left Lower Extremity Assessment LLE ROM/Strength/Tone: Deficits LLE ROM/Strength/Tone Deficits: alleyn dressing on knee.  generalized muscle atrophy Trunk Assessment Trunk Assessment: Other exceptions Trunk Exceptions: obese   Balance Balance Balance Assessed: Yes Static Sitting Balance Static Sitting - Balance Support: Feet supported;No upper extremity supported Static Sitting - Level of Assistance: 7: Independent Static Sitting - Comment/# of Minutes: 4 Static Standing Balance Static Standing - Balance Support: Bilateral upper extremity supported;During functional activity Static Standing - Level of Assistance: 5: Stand by assistance Static Standing - Comment/# of Minutes: pt able to use RW for support in standing  End of Session PT - End of Session Activity Tolerance: Patient limited by fatigue Patient left: in chair;with nursing in room Nurse Communication: Mobility status  GP   Rosey Bath K. Manson Passey, [place] [phone] 08/06/2012, 11:31 AM

## 2012-08-06 NOTE — Progress Notes (Signed)
CSW continuing to follow for disposition planning.  Pt evaluated by PT today and discussed that even though pt did not perform as well with OT evaluation, PT feels that pt is close to baseline and will be appropriate to return to [facility] with Home Health PT.  CSW discussed with pt at bedside whose preference is to return to [facility] and pt is agreeable to have home health at ALF.  CSW contacted [facility] and updated facility. Per facility, facility has in-house home health services.  CSW to continue to follow and facilitate pt discharge needs back to [facility] when pt medically stable for discharge.  Jacklynn Lewis, MSW, LCSWA  Clinical Social Work [phone]

## 2012-08-06 NOTE — Progress Notes (Signed)
[facility] PROGRESS NOTE  [name] NGE:[id number] DOB: 12/15/1934 DOA: 08/04/2012 PCP: Leanor Rubenstein, MD  Assessment/Plan:  #1 hypoglycemia Likely secondary to oral hypoglycemic medications of Actos and amaryl. Hemoglobin A1c is 5.4. CBGs have ranged from 106 to 133. Follow. Continue to hold oral hypoglycemic agents.  #2 acute encephalopathy Likely secondary to problem #1 a urinary tract infection. Clinical improvement. CT of the head was negative. CBGs have improved off of oral hypoglycemic agents. Urine cultures are pending. Continue empiric IV Rocephin. Follow.  #3 urinary tract infection Urine cultures are pending. Continue IV Rocephin.  #4 hypertension Stable. Continue current regimen of lisinopril. Titrate as needed.  #5 diabetes mellitus well controlled Globin A1c is 5.4. Continue to hold oral hypoglycemic agents secondary to problem #1.  Code Status: Full Family Communication: Updated patient no family at bedside. Disposition Plan: Back to SNF when medically stable   Consultants:  None  Procedures:  CT Head without contrast 08/04/2012  Antibiotics:  IV Rocephin 08/05/12  HPI/Subjective: Patient with no complaints.  Objective: Filed Vitals:   08/05/12 0140 08/05/12 0450 08/05/12 2123 08/06/12 0628  BP: 141/84 123/73 147/81 180/82  Pulse: 87 88 72 92  Temp: 97.9 F (36.6 C) 98.7 F (37.1 C) 98.7 F (37.1 C) 98.1 F (36.7 C)  TempSrc: Oral Oral Oral Oral  Resp: 20 20 18 18   Height: 5\' 5"  (1.651 m)     Weight: 99 kg (218 lb 4.1 oz)     SpO2: 93% 91% 94% 95%    Intake/Output Summary (Last 24 hours) at 08/06/12 1522 Last data filed at 08/05/12 2124  Gross per 24 hour  Intake    720 ml  Output      2 ml  Net    718 ml   Filed Weights   08/05/12 0140  Weight: 99 kg (218 lb 4.1 oz)    Exam:   General:  NAD  Cardiovascular: RRR  Respiratory: CTAB  Abdomen: Soft, nontender, nondistended, positive bowel sounds.  Data  Reviewed: Basic Metabolic Panel:  Recent Labs Lab 08/04/12 1916 08/05/12 0411 08/06/12 0407  NA 138 137 140  K 3.5 3.7 3.9  CL 102 105 104  CO2 28 27 30   GLUCOSE 45* 202* 112*  BUN 14 11 13   CREATININE 0.72 0.66 0.67  CALCIUM 8.9 8.6 8.4   Liver Function Tests:  Recent Labs Lab 08/04/12 1916 08/05/12 0411  AST 23 24  ALT 15 15  ALKPHOS 57 52  BILITOT 0.2* 0.2*  PROT 6.2 5.5*  ALBUMIN 2.9* 2.6*   No results found for this basename: LIPASE, AMYLASE,  in the last 168 hours No results found for this basename: AMMONIA,  in the last 168 hours CBC:  Recent Labs Lab 08/04/12 1916 08/05/12 0411 08/06/12 0407  WBC 9.8 7.0 5.5  NEUTROABS 7.8*  --   --   HGB 14.2 12.6 12.4  HCT 42.5 39.4 37.8  MCV 98.4 99.7 100.0  PLT 160 146* 147*   Cardiac Enzymes: No results found for this basename: CKTOTAL, CKMB, CKMBINDEX, TROPONINI,  in the last 168 hours BNP (last 3 results) No results found for this basename: PROBNP,  in the last 8760 hours CBG:  Recent Labs Lab 08/05/12 1718 08/05/12 2007 08/05/12 2348 08/06/12 0745 08/06/12 1227  GLUCAP 135* 140* 131* 111* 133*    Recent Results (from the past 240 hour(s))  URINE CULTURE     Status: None   Collection Time    08/04/12 11:13 PM  Result Value Range Status   Specimen Description URINE, CATHETERIZED   Final   Special Requests NONE   Final   Culture  Setup Time 08/05/2012 16:10   Final   Colony Count PENDING   Incomplete   Culture Culture reincubated for better growth   Final   Report Status PENDING   Incomplete  MRSA PCR SCREENING     Status: None   Collection Time    08/05/12  2:50 AM      Result Value Range Status   MRSA by PCR NEGATIVE  NEGATIVE Final   Comment:            The GeneXpert MRSA Assay (FDA     approved for NASAL specimens     only), is one component of a     comprehensive MRSA colonization     surveillance program. It is not     intended to diagnose MRSA     infection nor to guide or      monitor treatment for     MRSA infections.     Studies: Dg Chest 2 View  08/04/2012  *RADIOLOGY REPORT*  Clinical Data: Hypoglycemia.  Lethargy.  Short of breath.  CHEST - 2 VIEW  Comparison: 05/23/2012.12/02/2010 chest radiograph.  Findings: Low volume chest.  Basilar atelectasis.  Increased density is present over the lower lobes on the lateral view however this is favored to be due to atelectasis rather than pneumonia/airspace disease.  The appearance is similar to a low volume lateral radiograph of 12/02/2010. Cardiopericardial silhouette appears mildly enlarged, even allowing for low volumes. Low volumes accentuate the tortuous thoracic aorta.  IMPRESSION: Low volume chest with bilateral basilar atelectasis.   Original Report Authenticated By: Andreas Newport, M.D.    Ct Head Wo Contrast  08/04/2012  *RADIOLOGY REPORT*  Clinical Data: Altered mental status, dizziness  CT HEAD WITHOUT CONTRAST  Technique:  Contiguous axial images were obtained from the base of the skull through the vertex without contrast.  Comparison: Prior CT scan of the head 05/23/2012  Findings: No acute intracranial hemorrhage, acute infarction, mass lesion, mass effect, hydrocephalus or midline shift.   Gray-white differentiation is preserved throughout.  Stable pattern of periventricular, subcortical and deep white matter hypoattenuation which is nonspecific but most consistent with the sequela of longstanding microvascular ischemic changes.  Calcification noted in the bilateral internal carotid arteries.  Normal aeration of the mastoid air cells and paranasal sinuses.  Globes and orbits are unremarkable.  Stable soft tissue lesions.  No acute calvarial abnormality.  IMPRESSION:  1.  No acute intracranial abnormality. 2.  Stable mild atrophy and small vessel ischemic white matter changes. 3.  Intracranial atherosclerosis.   Original Report Authenticated By: Malachy Moan, M.D.     Scheduled Meds: . amantadine  100 mg Oral  Daily  . antiseptic oral rinse  15 mL Mouth Rinse BID  . aspirin EC  81 mg Oral Daily  . atorvastatin  20 mg Oral QHS  . calcium-vitamin D  1 tablet Oral BID  . cefTRIAXone (ROCEPHIN)  IV  1 g Intravenous Q24H  . darifenacin  7.5 mg Oral q morning - 10a  . Difluprednate  1 drop Ophthalmic BID  . lisinopril  10 mg Oral Daily  . Nepafenac  1 drop Ophthalmic Daily  . omega-3 acid ethyl esters  1 g Oral BID  . perphenazine  40 mg Oral QHS  . perphenazine  8 mg Oral BID WC   Continuous Infusions: . sodium chloride  75 mL/hr at 08/05/12 1426    Principal Problem:   Hypoglycemia Active Problems:   HTN (hypertension)   Diabetes   Agitation   UTI (lower urinary tract infection)    Time spent: > 35 mins    [facility]  [facility] Pager [phone]. If 8PM-8AM, please contact night-coverage at [email or web address], password [facility] 08/06/2012, 3:22 PM  LOS: 2 days

## 2012-08-06 NOTE — Evaluation (Signed)
Occupational Therapy Evaluation Patient Details Name: [name] MRN: [id number] DOB: 1935/05/22 Today's Date: 08/06/2012 Time: 7829-5621 OT Time Calculation (min): 22 min  OT Assessment / Plan / Recommendation Clinical Impression  This 78 year old female was admitted from [facility] with hypoglycemia.  She was confused and has a h/o schizophrenia.  Pt is appropriate for skilled OT to increase independence with adls to decrease burden of care at next venue    OT Assessment  Patient needs continued OT Services    Follow Up Recommendations  SNF    Barriers to Discharge      Equipment Recommendations  Other (comment) (to be further assessed:  can't use 3:1 yet)    Recommendations for Other Services    Frequency  Min 2X/week    Precautions / Restrictions Precautions Precautions: Fall Restrictions Weight Bearing Restrictions: No   Pertinent Vitals/Pain bil shoulders sore with assessment/movement    ADL  Grooming: Performed;Wash/dry hands;Wash/dry face;Set up Where Assessed - Grooming: Supine, head of bed up Upper Body Bathing: Performed;Supervision/safety (mod vcs to continue) Where Assessed - Upper Body Bathing: Supine, head of bed up Lower Body Bathing: Performed;+1 Total assistance Where Assessed - Lower Body Bathing: Rolling right and/or left Upper Body Dressing: Performed;Minimal assistance Where Assessed - Upper Body Dressing: Supine, head of bed up Lower Body Dressing: Performed;+1 Total assistance Where Assessed - Lower Body Dressing: Rolling right and/or left Toileting - Clothing Manipulation and Hygiene: Performed;+1 Total assistance Where Assessed - Toileting Clothing Manipulation and Hygiene: Rolling right and/or left Transfers/Ambulation Related to ADLs: unable to sit unsupported. Max A to eob and total Ato scoot forward ADL Comments: needs cues to continue    OT Diagnosis: Generalized weakness;Cognitive deficits  OT Problem List: Decreased  strength;Decreased activity tolerance;Impaired balance (sitting and/or standing);Decreased cognition;Decreased safety awareness OT Treatment Interventions: Self-care/ADL training;Therapeutic exercise;Therapeutic activities;Cognitive remediation/compensation;Patient/family education;Balance training   OT Goals Acute Rehab OT Goals OT Goal Formulation: Patient unable to participate in goal setting (pt generally agreeable) Time For Goal Achievement: 08/21/12 Potential to Achieve Goals: Good Miscellaneous OT Goals Miscellaneous OT Goal #1: pt will complete ub adls in supported sitting with supervision and no more than 2 vcs per activity OT Goal: Miscellaneous Goal #1 - Progress: Goal set today Miscellaneous OT Goal #2: pt will maintain unsupported sitting x 2 minutes with min guard in prep for adls OT Goal: Miscellaneous Goal #2 - Progress: Goal set today Miscellaneous OT Goal #3: pt will complete one set 10 reps FF to each UE with 1 vc OT Goal: Miscellaneous Goal #3 - Progress: Goal set today  Visit Information  Last OT Received On: 08/06/12    Subjective Data  Subjective: I can'tbelieve i'm so weak Patient Stated Goal: none stated   Prior Functioning     Home Living Additional Comments: from [facility] Prior Function Comments: unknown. Pt states she does own self care and uses walker Communication Communication: HOH Dominant Hand: Right         Vision/Perception     Cognition  Cognition Overall Cognitive Status: Impaired Area of Impairment: Attention;Following commands;Safety/judgement Arousal/Alertness: Awake/alert Orientation Level: Disoriented to;Place;Time;Situation Behavior During Session: [facility] for tasks performed Current Attention Level: Sustained Following Commands: Follows one step commands consistently Safety/Judgement: Decreased awareness of safety precautions    Extremity/Trunk Assessment Right Upper Extremity Assessment RUE ROM/Strength/Tone: Within  functional levels Left Upper Extremity Assessment LUE ROM/Strength/Tone: Within functional levels     Mobility Bed Mobility Bed Mobility: Rolling Right Rolling Right: 3: Mod assist;With rail  Rolling Left: 3: Mod assist;With rail Supine to Sit: 2: Max assist Details for Bed Mobility Assistance: total a to scoot eob.  unable to maintain static balance     Exercise     Balance Balance Balance Assessed: Yes Static Sitting Balance Static Sitting - Balance Support: Bilateral upper extremity supported Static Sitting - Level of Assistance: 4: Min assist;3: Mod assist Static Sitting - Comment/# of Minutes: 4   End of Session OT - End of Session Activity Tolerance: Patient limited by fatigue Patient left: in bed;with call bell/phone within reach  GO     Doris Miller [facility] 08/06/2012, 10:36 AM Marica Otter, OTR/L [phone] 08/06/2012

## 2012-08-07 LAB — CBC
HCT: 41 % (ref 36.0–46.0)
MCHC: 32.7 g/dL (ref 30.0–36.0)
Platelets: 158 10*3/uL (ref 150–400)
RDW: 13 % (ref 11.5–15.5)

## 2012-08-07 LAB — BASIC METABOLIC PANEL
BUN: 11 mg/dL (ref 6–23)
Calcium: 8.8 mg/dL (ref 8.4–10.5)
Chloride: 104 mEq/L (ref 96–112)
Creatinine, Ser: 0.66 mg/dL (ref 0.50–1.10)
GFR calc Af Amer: >90 mL/min (ref >90)
GFR calc non Af Amer: 84 mL/min — ABNORMAL LOW (ref >90)

## 2012-08-07 LAB — GLUCOSE, CAPILLARY: Glucose-Capillary: 112 mg/dL — ABNORMAL HIGH (ref 70–99)

## 2012-08-07 MED ORDER — CEPHALEXIN 250 MG PO CAPS: 500.0000 mg | ORAL_CAPSULE | Freq: Two times a day (BID) | ORAL | Status: DC

## 2012-08-07 NOTE — Discharge Summary (Signed)
Physician Discharge Summary  [name] JXB:[id number] DOB: [birth year] DOA: 08/04/2012  PCP: Leanor Rubenstein, MD  Admit date: 08/04/2012 Discharge date: 08/07/2012  Time spent: 30 minutes  Recommendations for Outpatient Follow-up:  1. Please follow CBG, consider resume medications for diabetes. Could consider Metformin, to avoid hypoglycemia.   Discharge Diagnoses:    Hypoglycemia   Encephalopathy.    UTI (lower urinary tract infection)   HTN (hypertension)   Diabetes     Discharge Condition: Stable.  Diet recommendation: Heart healthy, Diabetic.  Filed Weights   08/05/12 0140  Weight: 99 kg (218 lb 4.1 oz)    History of present illness:  78 year old female with past medical history of HTN, DM, dyslipidemia, schizophrenia from nursing home who brought in after having an episode of agitation and lowe blood glucose. Patient is not a good historian due to mental status changes. Per ED report CBG recovered to WNL after D 50. In ED, blood glucose again on low side but improved with D 50.41, 98, 62  In ED, evaluation also included CT head which was essentially unremarkable. CXR showed bibasilar atelectasis. BMP and CBC unrevealing. Urinalysis showed large leukocyte esterases   Hospital Course:  1 hypoglycemia  Likely secondary to oral hypoglycemic medications of Actos and amaryl. Hemoglobin A1c is 5.4. CBGs have ranged from 106 to 133. Follow. Continue to hold oral hypoglycemic agents. Consider metformin if medication is needed.  2 acute encephalopathy  Likely secondary to problem #1 a urinary tract infection. Clinical improvement. CT of the head was negative. CBGs have improved off of oral hypoglycemic agents. Urine cultures are pending. Treated  empiric IV Rocephin for 2 days. Improved. #3 urinary tract infection  Urine cultures staph coagulase. Received IV Rocephin for 2 days. Patient will be discharge on Keflex for 3 more days.  #4 hypertension  Stable. Continue current regimen of  lisinopril. Titrate as needed.  #5 diabetes mellitus well controlled  Globin A1c is 5.4. Continue to hold oral hypoglycemic agents secondary to problem #1.   Procedures:  None.  Consultations:  None.  Discharge Exam: Filed Vitals:   08/06/12 0628 08/06/12 1435 08/06/12 2215 08/07/12 0701  BP: 180/82 162/80 147/95 150/98  Pulse: 92 89 86 70  Temp: 98.1 F (36.7 C) 97.3 F (36.3 C) 98.1 F (36.7 C) 97.8 F (36.6 C)  TempSrc: Oral Oral Oral Oral  Resp: 18 18 16 16   Height:      Weight:      SpO2: 95% 94% 93% 92%    General: No distress. Cardiovascular: S 1, S 2 RRR Respiratory: CTA  Discharge Instructions  Discharge Orders   Future Orders Complete By Expires     Diet - low sodium heart healthy  As directed     Increase activity slowly  As directed         Medication List    STOP taking these medications       glimepiride 4 MG tablet  Commonly known as:  AMARYL     pioglitazone 30 MG tablet  Commonly known as:  ACTOS      TAKE these medications       amantadine 100 MG capsule  Commonly known as:  SYMMETREL  Take 100 mg by mouth daily.     aspirin EC 81 MG tablet  Take 81 mg by mouth daily.     atorvastatin 20 MG tablet  Commonly known as:  LIPITOR  Take 20 mg by mouth at bedtime.  BESIVANCE 0.6 % Susp  Generic drug:  Besifloxacin HCl  Apply to eye. Instill 1 gtt od twice daily x21 days after surgery     cephALEXin 250 MG capsule  Commonly known as:  KEFLEX  Take 2 capsules (500 mg total) by mouth 2 (two) times daily.     darifenacin 7.5 MG 24 hr tablet  Commonly known as:  ENABLEX  Take 7.5 mg by mouth every morning.     DUREZOL 0.05 % Emul  Generic drug:  Difluprednate  Apply 1 drop to eye. Instill 1 gtt od bid x 2 weeks after surgery then decrease to qd until disc.     fish oil-omega-3 fatty acids 1000 MG capsule  Take 1 g by mouth 2 (two) times daily.     ILEVRO 0.3 % Susp  Generic drug:  Nepafenac  Apply 1 drop to eye. Instill  1 gtt od in the morning     lisinopril 10 MG tablet  Commonly known as:  PRINIVIL,ZESTRIL  Take 10 mg by mouth daily.     loperamide 2 MG capsule  Commonly known as:  IMODIUM  Take 2 mg by mouth 4 (four) times daily as needed. For diarhea     OSCAL 500/200 D-3 500-200 MG-UNIT per tablet  Generic drug:  calcium-vitamin D  Take 1 tablet by mouth 2 (two) times daily.     perphenazine 8 MG tablet  Commonly known as:  TRILAFON  Take 8-40 mg by mouth 3 (three) times daily. Take 1 tablet at 8am, 1 tablet at 5pm, and 5 tablets at bedtime          The results of significant diagnostics from this hospitalization (including imaging, microbiology, ancillary and laboratory) are listed below for reference.    Significant Diagnostic Studies: Dg Chest 2 View  08/04/2012  *RADIOLOGY REPORT*  Clinical Data: Hypoglycemia.  Lethargy.  Short of breath.  CHEST - 2 VIEW  Comparison: 05/23/2012.12/02/2010 chest radiograph.  Findings: Low volume chest.  Basilar atelectasis.  Increased density is present over the lower lobes on the lateral view however this is favored to be due to atelectasis rather than pneumonia/airspace disease.  The appearance is similar to a low volume lateral radiograph of 12/02/2010. Cardiopericardial silhouette appears mildly enlarged, even allowing for low volumes. Low volumes accentuate the tortuous thoracic aorta.  IMPRESSION: Low volume chest with bilateral basilar atelectasis.   Original Report Authenticated By: Andreas Newport, M.D.    Ct Head Wo Contrast  08/04/2012  *RADIOLOGY REPORT*  Clinical Data: Altered mental status, dizziness  CT HEAD WITHOUT CONTRAST  Technique:  Contiguous axial images were obtained from the base of the skull through the vertex without contrast.  Comparison: Prior CT scan of the head 05/23/2012  Findings: No acute intracranial hemorrhage, acute infarction, mass lesion, mass effect, hydrocephalus or midline shift.   Gray-white differentiation is preserved  throughout.  Stable pattern of periventricular, subcortical and deep white matter hypoattenuation which is nonspecific but most consistent with the sequela of longstanding microvascular ischemic changes.  Calcification noted in the bilateral internal carotid arteries.  Normal aeration of the mastoid air cells and paranasal sinuses.  Globes and orbits are unremarkable.  Stable soft tissue lesions.  No acute calvarial abnormality.  IMPRESSION:  1.  No acute intracranial abnormality. 2.  Stable mild atrophy and small vessel ischemic white matter changes. 3.  Intracranial atherosclerosis.   Original Report Authenticated By: Malachy Moan, M.D.     Microbiology: Recent Results (from the past 240 hour(s))  URINE CULTURE     Status: None   Collection Time    08/04/12 11:13 PM      Result Value Range Status   Specimen Description URINE, CATHETERIZED   Final   Special Requests NONE   Final   Culture  Setup Time 08/05/2012 16:10   Final   Colony Count >=100,000 COLONIES/ML   Final   Culture     Final   Value: STAPHYLOCOCCUS SPECIES (COAGULASE NEGATIVE)     Note: RIFAMPIN AND GENTAMICIN SHOULD NOT BE USED AS SINGLE DRUGS FOR TREATMENT OF STAPH INFECTIONS.   Report Status PENDING   Incomplete  MRSA PCR SCREENING     Status: None   Collection Time    08/05/12  2:50 AM      Result Value Range Status   MRSA by PCR NEGATIVE  NEGATIVE Final   Comment:            The GeneXpert MRSA Assay (FDA     approved for NASAL specimens     only), is one component of a     comprehensive MRSA colonization     surveillance program. It is not     intended to diagnose MRSA     infection nor to guide or     monitor treatment for     MRSA infections.     Labs: Basic Metabolic Panel:  Recent Labs Lab 08/04/12 1916 08/05/12 0411 08/06/12 0407 08/07/12 0343  NA 138 137 140 140  K 3.5 3.7 3.9 3.7  CL 102 105 104 104  CO2 28 27 30 29   GLUCOSE 45* 202* 112* 112*  BUN 14 11 13 11   CREATININE 0.72 0.66 0.67  0.66  CALCIUM 8.9 8.6 8.4 8.8   Liver Function Tests:  Recent Labs Lab 08/04/12 1916 08/05/12 0411  AST 23 24  ALT 15 15  ALKPHOS 57 52  BILITOT 0.2* 0.2*  PROT 6.2 5.5*  ALBUMIN 2.9* 2.6*   CBC:  Recent Labs Lab 08/04/12 1916 08/05/12 0411 08/06/12 0407 08/07/12 0343  WBC 9.8 7.0 5.5 6.5  NEUTROABS 7.8*  --   --   --   HGB 14.2 12.6 12.4 13.4  HCT 42.5 39.4 37.8 41.0  MCV 98.4 99.7 100.0 99.3  PLT 160 146* 147* 158   Cardiac Enzymes: No results found for this basename: CKTOTAL, CKMB, CKMBINDEX, TROPONINI,  in the last 168 hours BNP: BNP (last 3 results) No results found for this basename: PROBNP,  in the last 8760 hours CBG:  Recent Labs Lab 08/06/12 1227 08/06/12 1524 08/07/12 0008 08/07/12 0500 08/07/12 0731  GLUCAP 133* 106* 139* 112* 125*       Signed:  Octaviano Mukai  [facility] 08/07/2012, 9:58 AM

## 2012-08-07 NOTE — Progress Notes (Signed)
Pt is to be discharged to [facility] at [place] today. Pt is in NAD, IV is out, all paperwork has been reviewed/discussed with patient, and there are no questions/concerns at this time. Assessment is unchanged from this morning. Pt is to be transported via [facility].

## 2012-08-07 NOTE — Progress Notes (Signed)
Pt for discharge back to [name].  CSW faxed pt discharge information to facility and confirmed discharge information was received and reviewed.  CSW provided RN phone number to call report.  CSW discussed with pt at bedside who asked CSW to contact pt son, Oley Balm to discuss transportation back to [facility].  CSW spoke with pt son whose preference is for [name] to transport pt.   CSW contacted [facility] who confirmed that [name] was available and will arrive at hospital between 12:30pm and 12:45pm.   CSW notified pt at bedside and pt son via telephone.  No further social work needs identified at this time.  CSW signing off.  Jacklynn Lewis, MSW, LCSWA  Clinical Social Work [phone]

## 2012-08-08 LAB — URINE CULTURE

## 2012-09-23 ENCOUNTER — Emergency Department (HOSPITAL_COMMUNITY): Payer: Medicare Other

## 2012-09-23 ENCOUNTER — Encounter (HOSPITAL_COMMUNITY): Payer: Self-pay | Admitting: Emergency Medicine

## 2012-09-23 ENCOUNTER — Emergency Department (HOSPITAL_COMMUNITY)
Admission: EM | Admit: 2012-09-23 | Discharge: 2012-09-24 | Disposition: A | Discharge status: Home / Self Care | Payer: Medicare Other | Attending: Emergency Medicine | Admitting: Emergency Medicine

## 2012-09-23 DIAGNOSIS — E119 Type 2 diabetes mellitus without complications: Secondary | ICD-10-CM | POA: Insufficient documentation

## 2012-09-23 DIAGNOSIS — W06XXXA Fall from bed, initial encounter: Secondary | ICD-10-CM | POA: Insufficient documentation

## 2012-09-23 DIAGNOSIS — E785 Hyperlipidemia, unspecified: Secondary | ICD-10-CM | POA: Insufficient documentation

## 2012-09-23 DIAGNOSIS — R0602 Shortness of breath: Secondary | ICD-10-CM | POA: Insufficient documentation

## 2012-09-23 DIAGNOSIS — I10 Essential (primary) hypertension: Secondary | ICD-10-CM | POA: Insufficient documentation

## 2012-09-23 DIAGNOSIS — W19XXXA Unspecified fall, initial encounter: Secondary | ICD-10-CM

## 2012-09-23 DIAGNOSIS — Z7982 Long term (current) use of aspirin: Secondary | ICD-10-CM | POA: Insufficient documentation

## 2012-09-23 DIAGNOSIS — Y9210 Unspecified residential institution as the place of occurrence of the external cause: Secondary | ICD-10-CM | POA: Insufficient documentation

## 2012-09-23 DIAGNOSIS — F209 Schizophrenia, unspecified: Secondary | ICD-10-CM | POA: Insufficient documentation

## 2012-09-23 DIAGNOSIS — Y939 Activity, unspecified: Secondary | ICD-10-CM | POA: Insufficient documentation

## 2012-09-23 DIAGNOSIS — S0990XA Unspecified injury of head, initial encounter: Secondary | ICD-10-CM | POA: Insufficient documentation

## 2012-09-23 DIAGNOSIS — Z79899 Other long term (current) drug therapy: Secondary | ICD-10-CM | POA: Insufficient documentation

## 2012-09-23 LAB — CBC
HCT: 45.5 % (ref 36.0–46.0)
Hemoglobin: 15.1 g/dL — ABNORMAL HIGH (ref 12.0–15.0)
MCH: 32.3 pg (ref 26.0–34.0)
MCV: 97.4 fL (ref 78.0–100.0)
RBC: 4.67 MIL/uL (ref 3.87–5.11)

## 2012-09-23 LAB — BASIC METABOLIC PANEL
BUN: 12 mg/dL (ref 6–23)
CO2: 28 mEq/L (ref 19–32)
Calcium: 8.3 mg/dL — ABNORMAL LOW (ref 8.4–10.5)
Creatinine, Ser: 1.07 mg/dL (ref 0.50–1.10)
Glucose, Bld: 125 mg/dL — ABNORMAL HIGH (ref 70–99)
Sodium: 138 mEq/L (ref 135–145)

## 2012-09-23 LAB — TROPONIN I: Troponin I: <0.3 ng/mL (ref <0.30)

## 2012-09-23 NOTE — ED Provider Notes (Signed)
History     CSN: [id number]  Arrival date & time 09/23/12  1911   First MD Initiated Contact with Patient 09/23/12 2033      Chief Complaint  Patient presents with  . Fall  . Shortness of Breath    HPI Patient fell out of bed this evening from her nursing home.  She was sent to the emergency department her the facilities fall policy.  As reported that she is at baseline mental status.  The patient denies headache at this time.  She denies nausea and vomiting.  She does state that she struck her head on the side table.  She denies pain in her arms or legs.  No hip pain.  She denies shortness of breath.  Is unclear how shortness of breath became part of her chief complaint.  The patient denies cough or congestion.  She denies fevers or chills.  No chest pain or abdominal pain.  No other complaints.  The patient states she feels fine.   Past Medical History  Diagnosis Date  . Hypertension   . Diabetes mellitus   . Schizophrenia   . Hyperlipemia     Past Surgical History  Procedure Laterality Date  . Cholecystectomy    . Abdominal hysterectomy      No family history on file.  History  Substance Use Topics  . Smoking status: Never Smoker   . Smokeless tobacco: Never Used  . Alcohol Use: No    OB History   Grav Para Term Preterm Abortions TAB SAB Ect Mult Living                  Review of Systems  All other systems reviewed and are negative.    Allergies  Review of patient's allergies indicates no known allergies.  Home Medications   Current Outpatient Rx  Name  Route  Sig  Dispense  Refill  . amantadine (SYMMETREL) 100 MG capsule   Oral   Take 100 mg by mouth daily.         Marland Kitchen aspirin EC 81 MG tablet   Oral   Take 81 mg by mouth daily.         Marland Kitchen atorvastatin (LIPITOR) 20 MG tablet   Oral   Take 20 mg by mouth at bedtime.         . calcium-vitamin D (OSCAL 500/200 D-3) 500-200 MG-UNIT per tablet   Oral   Take 1 tablet by mouth 2 (two) times  daily.         Marland Kitchen darifenacin (ENABLEX) 7.5 MG 24 hr tablet   Oral   Take 7.5 mg by mouth every morning.          . fish oil-omega-3 fatty acids 1000 MG capsule   Oral   Take 1 g by mouth 2 (two) times daily.          Marland Kitchen lisinopril (PRINIVIL,ZESTRIL) 10 MG tablet   Oral   Take 10 mg by mouth daily.         Marland Kitchen loperamide (IMODIUM) 2 MG capsule   Oral   Take 2 mg by mouth 4 (four) times daily as needed. For diarhea         . metFORMIN (GLUCOPHAGE) 500 MG tablet   Oral   Take 250 mg by mouth daily.         . mirtazapine (REMERON) 30 MG tablet   Oral   Take 30 mg by mouth at bedtime.         Marland Kitchen  Nepafenac (ILEVRO) 0.3 % SUSP   Ophthalmic   Apply 1 drop to eye. Instill 1 gtt od in the morning         . perphenazine (TRILAFON) 8 MG tablet   Oral   Take 8-40 mg by mouth 3 (three) times daily. Take 1 tablet at 8am, 1 tablet at 5pm, and 5 tablets at bedtime         . Besifloxacin HCl (BESIVANCE) 0.6 % SUSP   Ophthalmic   Apply to eye. Instill 1 gtt od twice daily x21 days after surgery         . cephALEXin (KEFLEX) 250 MG capsule   Oral   Take 2 capsules (500 mg total) by mouth 2 (two) times daily.   6 capsule   0   . Difluprednate (DUREZOL) 0.05 % EMUL   Ophthalmic   Apply 1 drop to eye. Instill 1 gtt od bid x 2 weeks after surgery then decrease to qd until disc.           BP 102/65  Pulse 86  Temp(Src) 98.1 F (36.7 C) (Oral)  SpO2 96%  Physical Exam  Nursing note and vitals reviewed. Constitutional: She is oriented to person, place, and time. She appears well-developed and well-nourished. No distress.  HENT:  Head: Normocephalic and atraumatic.  Eyes: EOM are normal.  Neck: Normal range of motion. Neck supple.  No cervical tenderness or step-off  Cardiovascular: Normal rate, regular rhythm and normal heart sounds.   Pulmonary/Chest: Effort normal and breath sounds normal.  Abdominal: Soft. She exhibits no distension. There is no tenderness.   Musculoskeletal: Normal range of motion.  No thoracic or lumbar tenderness.  Full range of motion bilateral hips knees and ankles.  Full range of motion bilateral wrists elbows and shoulders  Neurological: She is alert and oriented to person, place, and time.  Skin: Skin is warm and dry.  Psychiatric: She has a normal mood and affect. Judgment normal.    ED Course  Procedures (including critical care time)  Labs Reviewed - No data to display Dg Chest 2 View  09/23/2012  *RADIOLOGY REPORT*  Clinical Data: Fall.  Shortness of breath.  CHEST - 2 VIEW  Comparison: Two-view chest 08/04/2012.  Findings: Cardiac enlargement is stable. Mild pulmonary vascular congestion is improved.  There is no frank edema.  There is blunting of the costophrenic sulci on the lateral view.  Small bilateral pleural effusions are suspected. No significant airspace consolidation is evident.  IMPRESSION:  1.  Stable cardiac enlargement. 2.  Decreased pulmonary vascular congestion and small bilateral pleural effusions.  This may still represent an element of congestive heart failure. 3.  No significant airspace consolidation.   Original Report Authenticated By: Marin Roberts, M.D.    Ct Head Wo Contrast  09/23/2012  *RADIOLOGY REPORT*  Clinical Data: Fall.  Shortness of breath.  CT HEAD WITHOUT CONTRAST  Technique:  Contiguous axial images were obtained from the base of the skull through the vertex without contrast.  Comparison: CT head without and as 08/04/2012.  Findings: Atrophy and extensive white matter disease is not significantly changed.  No acute cortical infarct, hemorrhage, or mass lesion is present.  The ventricles are proportionate to the degree of atrophy.  No significant extra-axial fluid collection is present.  Secretions are present posteriorly in the right ethmoid air cells.  The paranasal sinuses and mastoid air cells are otherwise clear.  IMPRESSION:  1.  Stable atrophy and diffuse white matter disease.  2.  Right ethmoid sinus disease. 3.  No acute intracranial abnormality.   Original Report Authenticated By: Marin Roberts, M.D.      1. Fall, initial encounter       MDM  The patient is well-appearing.  Her chest x-ray and head CT are without acute abnormality.  Labs are normal.  The patient walked in the emergency department without any difficulty in her walking O2 sat was 96% on room air.  At some point her her ER stay her O2 sats fell to around 84%.  I see evaluated the patient at this time it appears as though she was sleeping and she likely has sleep apnea.  She'll need followup with her primary care physician regarding this.  Her option levels quickly rose with deep breathing and awakening the patient.        Lyanne Co, MD 09/24/12 0010

## 2012-09-23 NOTE — ED Notes (Signed)
Per EMS: Pt is from [facility]. Pt fell out of bed after dinner. Pt denies pain. Facility sent patient for evaluation of fall per policy.

## 2012-09-24 NOTE — ED Notes (Signed)
Patient 02 sat 96% after walking.

## 2012-10-02 ENCOUNTER — Emergency Department (HOSPITAL_COMMUNITY): Payer: Medicare Other

## 2012-10-02 ENCOUNTER — Inpatient Hospital Stay (HOSPITAL_COMMUNITY)
Admission: EM | Admit: 2012-10-02 | Discharge: 2012-10-08 | DRG: 176 | Disposition: A | Discharge status: Skilled Nursing Facility | Payer: Medicare Other | Attending: Internal Medicine | Admitting: Internal Medicine

## 2012-10-02 ENCOUNTER — Encounter (HOSPITAL_COMMUNITY): Payer: Self-pay | Admitting: *Deleted

## 2012-10-02 DIAGNOSIS — R339 Retention of urine, unspecified: Secondary | ICD-10-CM | POA: Diagnosis not present

## 2012-10-02 DIAGNOSIS — R0902 Hypoxemia: Secondary | ICD-10-CM

## 2012-10-02 DIAGNOSIS — R5381 Other malaise: Secondary | ICD-10-CM

## 2012-10-02 DIAGNOSIS — I5032 Chronic diastolic (congestive) heart failure: Secondary | ICD-10-CM

## 2012-10-02 DIAGNOSIS — E669 Obesity, unspecified: Secondary | ICD-10-CM | POA: Diagnosis present

## 2012-10-02 DIAGNOSIS — R451 Restlessness and agitation: Secondary | ICD-10-CM

## 2012-10-02 DIAGNOSIS — I2699 Other pulmonary embolism without acute cor pulmonale: Secondary | ICD-10-CM

## 2012-10-02 DIAGNOSIS — F209 Schizophrenia, unspecified: Secondary | ICD-10-CM | POA: Diagnosis present

## 2012-10-02 DIAGNOSIS — IMO0002 Reserved for concepts with insufficient information to code with codable children: Secondary | ICD-10-CM | POA: Diagnosis present

## 2012-10-02 DIAGNOSIS — Z79899 Other long term (current) drug therapy: Secondary | ICD-10-CM

## 2012-10-02 DIAGNOSIS — E119 Type 2 diabetes mellitus without complications: Secondary | ICD-10-CM | POA: Diagnosis present

## 2012-10-02 DIAGNOSIS — S0003XA Contusion of scalp, initial encounter: Secondary | ICD-10-CM | POA: Diagnosis present

## 2012-10-02 DIAGNOSIS — E785 Hyperlipidemia, unspecified: Secondary | ICD-10-CM | POA: Diagnosis present

## 2012-10-02 DIAGNOSIS — Z7982 Long term (current) use of aspirin: Secondary | ICD-10-CM

## 2012-10-02 DIAGNOSIS — E162 Hypoglycemia, unspecified: Secondary | ICD-10-CM

## 2012-10-02 DIAGNOSIS — Z6832 Body mass index (BMI) 32.0-32.9, adult: Secondary | ICD-10-CM

## 2012-10-02 DIAGNOSIS — W1809XA Striking against other object with subsequent fall, initial encounter: Secondary | ICD-10-CM | POA: Diagnosis present

## 2012-10-02 DIAGNOSIS — N390 Urinary tract infection, site not specified: Secondary | ICD-10-CM

## 2012-10-02 DIAGNOSIS — I10 Essential (primary) hypertension: Secondary | ICD-10-CM | POA: Diagnosis present

## 2012-10-02 LAB — CBC WITH DIFFERENTIAL/PLATELET
Eosinophils Absolute: 0.3 10*3/uL (ref 0.0–0.7)
HCT: 46.5 % — ABNORMAL HIGH (ref 36.0–46.0)
Hemoglobin: 15.7 g/dL — ABNORMAL HIGH (ref 12.0–15.0)
Lymphs Abs: 1.6 10*3/uL (ref 0.7–4.0)
MCH: 32.2 pg (ref 26.0–34.0)
Monocytes Relative: 10 % (ref 3–12)
Neutro Abs: 5.3 10*3/uL (ref 1.7–7.7)
Neutrophils Relative %: 66 % (ref 43–77)
RBC: 4.87 MIL/uL (ref 3.87–5.11)

## 2012-10-02 LAB — BASIC METABOLIC PANEL
BUN: 9 mg/dL (ref 6–23)
Chloride: 101 mEq/L (ref 96–112)
Glucose, Bld: 107 mg/dL — ABNORMAL HIGH (ref 70–99)
Potassium: 3.8 mEq/L (ref 3.5–5.1)

## 2012-10-02 MED ORDER — HYDROCODONE-ACETAMINOPHEN 5-325 MG PO TABS
1.0000 | ORAL_TABLET | Freq: Once | ORAL | Status: AC
  Administered 2012-10-02: 1 via ORAL
  Filled 2012-10-02: qty 1

## 2012-10-02 NOTE — ED Notes (Signed)
md stated ok not to ambulate pt while walking d/t pt sats down to 86 w/o ambulation when o2 off

## 2012-10-02 NOTE — ED Notes (Signed)
Per ems pt from [facility]. Pt was attempting to go to [facility] and tripped and fell. Pt with hematoma to back of head and laceration. This was a non witnessed fall. Pt is alert and oriented to self.

## 2012-10-02 NOTE — ED Notes (Signed)
[id number]<AV> Expected date:<BR> Expected time:<BR> Means of arrival:<BR> Comments:<BR> EMS/elderly female from [facility]-fell-hit back of head

## 2012-10-02 NOTE — ED Provider Notes (Signed)
History     CSN: [id number]  Arrival date & time 10/02/12  2157   None     Chief Complaint  Patient presents with  . Fall  . Head Injury    (Consider location/radiation/quality/duration/timing/severity/associated sxs/prior treatment) HPI [name] with HTN, DM presenting via EMS from [facility] with a mechanical fall and head injury.  She states that she was trying to get to the bathroom and when she opened the door, it caught her off balance and she fell backward. She reports hitting her head on the floor but denies any loss of consciousness.  Reports pain on the back of her head from the fall.  States she feels a little weak.  Also has had a cough for the last week or so, but no shortness of breath.  Past Medical History  Diagnosis Date  . Hypertension   . Diabetes mellitus   . Schizophrenia   . Hyperlipemia     Past Surgical History  Procedure Laterality Date  . Cholecystectomy    . Abdominal hysterectomy      History reviewed. No pertinent family history.  History  Substance Use Topics  . Smoking status: Never Smoker   . Smokeless tobacco: Never Used  . Alcohol Use: No    OB History   Grav Para Term Preterm Abortions TAB SAB Ect Mult Living                  Review of Systems  Respiratory: Positive for cough.   Skin: Positive for wound.  Neurological: Positive for weakness and headaches.  All other systems reviewed and are negative.    Allergies  Review of patient's allergies indicates no known allergies.  Home Medications   Current Outpatient Rx  Name  Route  Sig  Dispense  Refill  . amantadine (SYMMETREL) 100 MG capsule   Oral   Take 100 mg by mouth daily.         Marland Kitchen aspirin EC 81 MG tablet   Oral   Take 81 mg by mouth daily.         Marland Kitchen atorvastatin (LIPITOR) 20 MG tablet   Oral   Take 20 mg by mouth at bedtime.         Marland Kitchen Besifloxacin HCl (BESIVANCE) 0.6 % SUSP   Ophthalmic   Apply to eye. Instill 1 gtt od twice daily x21 days  after surgery         . calcium-vitamin D (OSCAL 500/200 D-3) 500-200 MG-UNIT per tablet   Oral   Take 1 tablet by mouth 2 (two) times daily.         . cephALEXin (KEFLEX) 250 MG capsule   Oral   Take 2 capsules (500 mg total) by mouth 2 (two) times daily.   6 capsule   0   . darifenacin (ENABLEX) 7.5 MG 24 hr tablet   Oral   Take 7.5 mg by mouth every morning.          . Difluprednate (DUREZOL) 0.05 % EMUL   Ophthalmic   Apply 1 drop to eye. Instill 1 gtt od bid x 2 weeks after surgery then decrease to qd until disc.         . fish oil-omega-3 fatty acids 1000 MG capsule   Oral   Take 1 g by mouth 2 (two) times daily.          Marland Kitchen lisinopril (PRINIVIL,ZESTRIL) 10 MG tablet   Oral   Take 10 mg  by mouth daily.         Marland Kitchen loperamide (IMODIUM) 2 MG capsule   Oral   Take 2 mg by mouth 4 (four) times daily as needed. For diarhea         . metFORMIN (GLUCOPHAGE) 500 MG tablet   Oral   Take 250 mg by mouth daily.         . mirtazapine (REMERON) 30 MG tablet   Oral   Take 30 mg by mouth at bedtime.         . Nepafenac (ILEVRO) 0.3 % SUSP   Ophthalmic   Apply 1 drop to eye. Instill 1 gtt od in the morning         . perphenazine (TRILAFON) 8 MG tablet   Oral   Take 8-40 mg by mouth 3 (three) times daily. Take 1 tablet at 8am, 1 tablet at 5pm, and 5 tablets at bedtime           BP 163/101  Pulse 77  Temp(Src) 97.7 F (36.5 C) (Oral)  Resp 21  SpO2 93%  Physical Exam  Constitutional: She appears well-developed and well-nourished. No distress.  HENT:  Head: Normocephalic.    Mouth/Throat: Oropharynx is clear and moist. No oropharyngeal exudate.  Eyes: Conjunctivae and EOM are normal. Pupils are equal, round, and reactive to light. Right eye exhibits no discharge. Left eye exhibits no discharge.  Neck: Normal range of motion. Neck supple.  Cardiovascular: Normal rate, regular rhythm, normal heart sounds and intact distal pulses.   No murmur  heard. Pulmonary/Chest: Effort normal and breath sounds normal. No respiratory distress. She has no wheezes. She has no rales.  Neurological: She is alert. No cranial nerve deficit. She exhibits normal muscle tone.  Oriented to person and situation.  Skin: Skin is warm and dry. No rash noted. She is not diaphoretic. No erythema.  Psychiatric: She has a normal mood and affect. Thought content normal.    ED Course  Procedures (including critical care time)  Labs Reviewed - No data to display No results found.   No diagnosis found.    MDM  [name] from nursing home with mechanical fall and head injury.  Alert and oriented to person and situation, no focal deficits on exam.  Will get head CT.  Will also check CXR given report of cough and O2 sat of 90% on room air without h/o lung disease.  Will also check cbc, bmp, bnp.  Norco x1 for pain.  11:40PM: Labs are unremarkable.  CXR without acute process.  Desats to 86% off of oxygen.  Unclear etiology.  Will admit patient for further work up. Hospitalist will see the patient and place admission orders.        Phebe Colla, MD 10/03/12 [id number]

## 2012-10-03 ENCOUNTER — Encounter (HOSPITAL_COMMUNITY): Payer: Self-pay | Admitting: Internal Medicine

## 2012-10-03 DIAGNOSIS — R0902 Hypoxemia: Secondary | ICD-10-CM | POA: Diagnosis present

## 2012-10-03 DIAGNOSIS — I517 Cardiomegaly: Secondary | ICD-10-CM

## 2012-10-03 LAB — COMPREHENSIVE METABOLIC PANEL
BUN: 9 mg/dL (ref 6–23)
CO2: 32 mEq/L (ref 19–32)
Calcium: 9.8 mg/dL (ref 8.4–10.5)
Chloride: 94 mEq/L — ABNORMAL LOW (ref 96–112)
Creatinine, Ser: 0.75 mg/dL (ref 0.50–1.10)
GFR calc Af Amer: >90 mL/min (ref >90)
GFR calc non Af Amer: 81 mL/min — ABNORMAL LOW (ref >90)
Total Bilirubin: 0.5 mg/dL (ref 0.3–1.2)

## 2012-10-03 LAB — TROPONIN I
Troponin I: <0.3 ng/mL (ref <0.30)
Troponin I: <0.3 ng/mL (ref <0.30)
Troponin I: <0.3 ng/mL (ref <0.30)

## 2012-10-03 LAB — CBC
Hemoglobin: 14.7 g/dL (ref 12.0–15.0)
Platelets: 171 10*3/uL (ref 150–400)
RBC: 4.63 MIL/uL (ref 3.87–5.11)
WBC: 8.9 10*3/uL (ref 4.0–10.5)

## 2012-10-03 LAB — MRSA PCR SCREENING: MRSA by PCR: NEGATIVE

## 2012-10-03 LAB — GLUCOSE, CAPILLARY: Glucose-Capillary: 108 mg/dL — ABNORMAL HIGH (ref 70–99)

## 2012-10-03 MED ORDER — INSULIN ASPART 100 UNIT/ML ~~LOC~~ SOLN
0.0000 [IU] | Freq: Three times a day (TID) | SUBCUTANEOUS | Status: DC
  Administered 2012-10-03 – 2012-10-05 (×2): 1 [IU] via SUBCUTANEOUS
  Administered 2012-10-06: 2 [IU] via SUBCUTANEOUS

## 2012-10-03 MED ORDER — HYDROCODONE-ACETAMINOPHEN 5-325 MG PO TABS
1.0000 | ORAL_TABLET | ORAL | Status: DC | PRN
  Administered 2012-10-06 (×2): 2 via ORAL
  Administered 2012-10-06 – 2012-10-07 (×2): 1 via ORAL
  Filled 2012-10-03: qty 1
  Filled 2012-10-03 (×2): qty 2
  Filled 2012-10-03: qty 1

## 2012-10-03 MED ORDER — LISINOPRIL 10 MG PO TABS
10.0000 mg | ORAL_TABLET | Freq: Every morning | ORAL | Status: DC
  Administered 2012-10-03 – 2012-10-08 (×6): 10 mg via ORAL
  Filled 2012-10-03 (×6): qty 1

## 2012-10-03 MED ORDER — ONDANSETRON HCL 4 MG PO TABS: 4.0000 mg | ORAL_TABLET | Freq: Four times a day (QID) | ORAL | Status: DC | PRN

## 2012-10-03 MED ORDER — DIFLUPREDNATE 0.05 % OP EMUL: 1.0000 [drp] | Freq: Two times a day (BID) | OPHTHALMIC | Status: DC

## 2012-10-03 MED ORDER — SODIUM CHLORIDE 0.9 % IJ SOLN
3.0000 mL | Freq: Two times a day (BID) | INTRAMUSCULAR | Status: DC
  Administered 2012-10-03 – 2012-10-08 (×10): 3 mL via INTRAVENOUS

## 2012-10-03 MED ORDER — ACETAMINOPHEN 325 MG PO TABS: 650.0000 mg | ORAL_TABLET | Freq: Four times a day (QID) | ORAL | Status: DC | PRN

## 2012-10-03 MED ORDER — INSULIN ASPART 100 UNIT/ML ~~LOC~~ SOLN: 0.0000 [IU] | Freq: Every day | SUBCUTANEOUS | Status: DC

## 2012-10-03 MED ORDER — MIRTAZAPINE 30 MG PO TABS
30.0000 mg | ORAL_TABLET | Freq: Every day | ORAL | Status: DC
  Administered 2012-10-03 – 2012-10-07 (×5): 30 mg via ORAL
  Filled 2012-10-03 (×6): qty 1

## 2012-10-03 MED ORDER — ACETAMINOPHEN 650 MG RE SUPP: 650.0000 mg | Freq: Four times a day (QID) | RECTAL | Status: DC | PRN

## 2012-10-03 MED ORDER — DARIFENACIN HYDROBROMIDE ER 7.5 MG PO TB24
7.5000 mg | ORAL_TABLET | Freq: Every morning | ORAL | Status: DC
  Administered 2012-10-03: 7.5 mg via ORAL
  Filled 2012-10-03 (×2): qty 1

## 2012-10-03 MED ORDER — NEPAFENAC 0.3 % OP SUSP: 1.0000 [drp] | Freq: Every morning | OPHTHALMIC | Status: DC

## 2012-10-03 MED ORDER — BIOTENE DRY MOUTH MT LIQD
15.0000 mL | Freq: Two times a day (BID) | OROMUCOSAL | Status: DC
  Administered 2012-10-03 – 2012-10-08 (×11): 15 mL via OROMUCOSAL

## 2012-10-03 MED ORDER — PERPHENAZINE 8 MG PO TABS
40.0000 mg | ORAL_TABLET | Freq: Every day | ORAL | Status: DC
  Administered 2012-10-03 – 2012-10-07 (×5): 40 mg via ORAL
  Filled 2012-10-03 (×6): qty 5

## 2012-10-03 MED ORDER — DOCUSATE SODIUM 100 MG PO CAPS
100.0000 mg | ORAL_CAPSULE | Freq: Two times a day (BID) | ORAL | Status: DC
  Administered 2012-10-03 – 2012-10-08 (×11): 100 mg via ORAL
  Filled 2012-10-03 (×13): qty 1

## 2012-10-03 MED ORDER — ONDANSETRON HCL 4 MG/2ML IJ SOLN
4.0000 mg | Freq: Four times a day (QID) | INTRAMUSCULAR | Status: DC | PRN
  Filled 2012-10-03: qty 2

## 2012-10-03 MED ORDER — AMANTADINE HCL 100 MG PO CAPS
100.0000 mg | ORAL_CAPSULE | Freq: Every morning | ORAL | Status: DC
  Administered 2012-10-03 – 2012-10-08 (×6): 100 mg via ORAL
  Filled 2012-10-03 (×7): qty 1

## 2012-10-03 MED ORDER — ASPIRIN EC 81 MG PO TBEC
81.0000 mg | DELAYED_RELEASE_TABLET | Freq: Every morning | ORAL | Status: DC
  Administered 2012-10-03 – 2012-10-08 (×6): 81 mg via ORAL
  Filled 2012-10-03 (×6): qty 1

## 2012-10-03 MED ORDER — ATORVASTATIN CALCIUM 20 MG PO TABS
20.0000 mg | ORAL_TABLET | Freq: Every day | ORAL | Status: DC
  Administered 2012-10-03 – 2012-10-07 (×5): 20 mg via ORAL
  Filled 2012-10-03 (×6): qty 1

## 2012-10-03 MED ORDER — BESIFLOXACIN HCL 0.6 % OP SUSP: 1.0000 [drp] | Freq: Two times a day (BID) | OPHTHALMIC | Status: DC

## 2012-10-03 MED ORDER — PERPHENAZINE 8 MG PO TABS
8.0000 mg | ORAL_TABLET | ORAL | Status: DC
  Administered 2012-10-03 – 2012-10-08 (×11): 8 mg via ORAL
  Filled 2012-10-03 (×14): qty 1

## 2012-10-03 MED ORDER — SODIUM CHLORIDE 0.9 % IV SOLN
INTRAVENOUS | Status: AC
  Administered 2012-10-03: 03:00:00 via INTRAVENOUS

## 2012-10-03 NOTE — Progress Notes (Signed)
*  PRELIMINARY RESULTS* Echocardiogram 2D Echocardiogram has been performed.  Jeryl Columbia 10/03/2012, 12:21 PM

## 2012-10-03 NOTE — ED Provider Notes (Signed)
I saw and evaluated the patient, reviewed the resident's note and I agree with the findings and plan.   .Face to face Exam:  General:  Awake Resp: mild tachypnia Abd:  Nondistended Neuro:No focal weakness Lymph: No adenopathy  Nelia Shi, MD 10/03/12 1940

## 2012-10-03 NOTE — H&P (Signed)
PCP: Leanor Rubenstein, MD    Chief Complaint:  fall  HPI: [name] is a 78 y.o. female   has a past medical history of Hypertension; Diabetes mellitus; Schizophrenia; and Hyperlipemia.   Presented with  Patient have had a mechanical fall while walking to bathroom door. She does report feeling a bit confused after she hit her head. She is on Plavix. She hit her head on the floor. She was brought in to [facility] ER and was found to have posterior scalp hematoma not needing any stiches. CT of head showed no intracranial bleed. Incidentally she was found to be hypoxic on RA with O2 sat down to 86%. She was put on oxygen and improved. patient has no hx of requiring oxygen in the past. Denies any chest pain she does state that she gets short of breath sometimes but not currently. She never smoked patient is admitted for further evaluation of hypoxia. Currently she is sating 90% on RA.  Denies hx of sleep apnea. Of note patient has been admitted last week also after a fall and also was noted to be hypoxic on room air. At that time was thought to be secondary to sleep apnea since patient's oxygen saturation get worse when she was asleep.   Review of Systems:     Pertinent positives include: fall  Constitutional:  No weight loss, night sweats, Fevers, chills, fatigue, weight loss  HEENT:  No headaches, Difficulty swallowing,Tooth/dental problems,Sore throat,  No sneezing, itching, ear ache, nasal congestion, post nasal drip,  Cardio-vascular:  No chest pain, Orthopnea, PND, anasarca, dizziness, palpitations.no Bilateral lower extremity swelling  GI:  No heartburn, indigestion, abdominal pain, nausea, vomiting, diarrhea, change in bowel habits, loss of appetite, melena, blood in stool, hematemesis Resp:  no shortness of breath at rest. No dyspnea on exertion, No excess mucus, no productive cough, No non-productive cough, No coughing up of blood.No change in color of mucus.No wheezing. Skin:  no rash or  lesions. No jaundice GU:  no dysuria, change in color of urine, no urgency or frequency. No straining to urinate.  No flank pain.  Musculoskeletal:  No joint pain or no joint swelling. No decreased range of motion. No back pain.  Psych:  No change in mood or affect. No depression or anxiety. No memory loss.  Neuro: no localizing neurological complaints, no tingling, no weakness, no double vision, no gait abnormality, no slurred speech, no confusion  Otherwise ROS are negative except for above, 10 systems were reviewed  Past Medical History: Past Medical History  Diagnosis Date  . Hypertension   . Diabetes mellitus   . Schizophrenia   . Hyperlipemia    Past Surgical History  Procedure Laterality Date  . Cholecystectomy    . Abdominal hysterectomy       Medications: Prior to Admission medications   Medication Sig Start Date End Date Taking? Authorizing Provider  amantadine (SYMMETREL) 100 MG capsule Take 100 mg by mouth every morning.    Yes Historical Provider, MD  aspirin EC 81 MG tablet Take 81 mg by mouth every morning.    Yes Historical Provider, MD  atorvastatin (LIPITOR) 20 MG tablet Take 20 mg by mouth at bedtime.   Yes Historical Provider, MD  Besifloxacin HCl (BESIVANCE) 0.6 % SUSP Place 1 drop into the right eye 2 (two) times daily.    Yes Historical Provider, MD  calcium-vitamin D (OSCAL 500/200 D-3) 500-200 MG-UNIT per tablet Take 1 tablet by mouth 2 (two) times daily.  Yes Historical Provider, MD  darifenacin (ENABLEX) 7.5 MG 24 hr tablet Take 7.5 mg by mouth every morning.    Yes Historical Provider, MD  Difluprednate (DUREZOL) 0.05 % EMUL Place 1 drop into the right eye 2 (two) times daily.    Yes Historical Provider, MD  fish oil-omega-3 fatty acids 1000 MG capsule Take 1 g by mouth 2 (two) times daily.    Yes Historical Provider, MD  lisinopril (PRINIVIL,ZESTRIL) 10 MG tablet Take 10 mg by mouth every morning.    Yes Historical Provider, MD  metFORMIN  (GLUCOPHAGE) 500 MG tablet Take 250 mg by mouth every morning.    Yes Historical Provider, MD  mirtazapine (REMERON) 30 MG tablet Take 30 mg by mouth at bedtime.   Yes Historical Provider, MD  Nepafenac (ILEVRO) 0.3 % SUSP Place 1 drop into the right eye every morning.    Yes Historical Provider, MD  perphenazine (TRILAFON) 8 MG tablet Take 8-40 mg by mouth 3 (three) times daily. Take 1 tablet in the morning, 1 tablet at 5pm, and 5 tablets at bedtime   Yes Historical Provider, MD    Allergies:  No Known Allergies  Social History:  Ambulatory walker Lives at assisted living   reports that she has never smoked. She has never used smokeless tobacco. She reports that she does not drink alcohol or use illicit drugs.   Family History: family history includes Heart disease in her mother and Kidney disease in her father.    Physical Exam: Patient Vitals for the past 24 hrs:  BP Temp Temp src Pulse Resp SpO2  10/03/12 0008 135/99 mmHg - - 71 16 96 %  10/02/12 2215 - - - - - 93 %  10/02/12 2204 163/101 mmHg 97.7 F (36.5 C) Oral 77 21 90 %    1. General:  in No Acute distress 2. Psychological: Alert and  Oriented 3. Head/ENT:   Dry Mucous Membranes                          Head with posterior hematoma and some abrasions, neck supple                          Normal   Dentition 4. SKIN: decreased Skin turgor,  Skin clean Dry and intact no rash 5. Heart: Regular rate and rhythm no Murmur, Rub or gallop 6. Lungs:   no wheezes or crackles poor effort, patient is mouth breathing, hypoxia worsens as she drifts off to sleep.  7. Abdomen: Soft, non-tender, Non distended 8. Lower extremities: no clubbing, cyanosis, or edema 9. Neurologically Grossly intact, moving all 4 extremities equally 10. MSK: Normal range of motion  body mass index is unknown because there is no weight on file.   Labs on Admission:   Recent Labs  10/02/12 2300  NA 136  K 3.8  CL 101  CO2 26  GLUCOSE 107*  BUN  9  CREATININE 0.70  CALCIUM 9.4   No results found for this basename: AST, ALT, ALKPHOS, BILITOT, PROT, ALBUMIN,  in the last 72 hours No results found for this basename: LIPASE, AMYLASE,  in the last 72 hours  Recent Labs  10/02/12 2300  WBC 8.0  NEUTROABS 5.3  HGB 15.7*  HCT 46.5*  MCV 95.5  PLT 163   No results found for this basename: CKTOTAL, CKMB, CKMBINDEX, TROPONINI,  in the last 72 hours No results found  for this basename: TSH, T4TOTAL, FREET3, T3FREE, THYROIDAB,  in the last 72 hours No results found for this basename: VITAMINB12, FOLATE, FERRITIN, TIBC, IRON, RETICCTPCT,  in the last 72 hours Lab Results  Component Value Date   HGBA1C 5.4 08/05/2012    The CrCl is unknown because both a height and weight (above a minimum accepted value) are required for this calculation. ABG    Component Value Date/Time   TCO2 18 09/14/2008 1930     Cultures:    Component Value Date/Time   SDES URINE, CATHETERIZED 08/04/2012 2313   SPECREQUEST NONE 08/04/2012 2313   CULT  Value: STAPHYLOCOCCUS SPECIES (COAGULASE NEGATIVE) Note: RIFAMPIN AND GENTAMICIN SHOULD NOT BE USED AS SINGLE DRUGS FOR TREATMENT OF STAPH INFECTIONS. 08/04/2012 2313   REPTSTATUS 08/08/2012 FINAL 08/04/2012 2313       Radiological Exams on Admission: Dg Chest 2 View  10/02/2012  *RADIOLOGY REPORT*  Clinical Data: Status post fall; cough.  Concern for chest injury.  CHEST - 2 VIEW  Comparison: Chest radiograph performed 09/23/2012  Findings: The lungs are relatively well-aerated.  Pulmonary vascularity is at the upper limits of normal.  Minimal bibasilar opacities likely reflect atelectasis.  No pleural effusion or pneumothorax is seen.  The heart is borderline normal in size.  No acute osseous abnormalities are seen.  A mild chronic compression deformity is noted at the upper lumbar spine.  IMPRESSION: Minimal bibasilar opacities likely reflect atelectasis; lungs otherwise clear.  No displaced rib fractures seen.    Original Report Authenticated By: Tonia Ghent, M.D.    Ct Head Wo Contrast  10/02/2012  *RADIOLOGY REPORT*  Clinical Data: Head trauma secondary to a fall.  Posterior scalp laceration.  CT HEAD WITHOUT CONTRAST  Technique:  Contiguous axial images were obtained from the base of the skull through the vertex without contrast.  Comparison: 09/23/2012  Findings: There is no acute intracranial hemorrhage, infarction, or mass lesion.  Scattered areas of periventricular white matter lucency consistent with small vessel ischemic changes, stable.  No acute osseous abnormality.  Prominent posterior scalp hematoma.  IMPRESSION: Scalp hematoma posteriorly.  No acute intracranial abnormality.   Original Report Authenticated By: Francene Boyers, M.D.     Chart has been reviewed  Assessment/Plan  78 yo female with history of schizophrenia and diabetes  Here after a fall. Been admitted for further workup of hypoxia.  Present on Admission:  . Hypoxemia - etiology not quite clear that seemed to get somewhat we'll improved after patients taking deep breaths. She is doing a lot of mouth breathing. There may be evidence of sleep apnea based on the fact that she does seem to be set worse when she is asleep. She'll need to have a sleep study done as an outpatient. Given the fact that chest x-ray did not show any evidence of acute infiltrates but did show in the past cardiomegaly and slight interstitial edema and now per my review still shows cardiomegaly will get echogram to evaluate for CHF. No risk factors for PE no chest pain. Patient had had this in the past does not a sudden change.  Marland Kitchen HTN (hypertension) -continue home medications  . Diabetes - hold metformin otherwise put on sliding scale of note patient had had history of hypoglycemia in the past   Prophylaxis SCDs avoid Lovenox as she has had recent head trauma  CODE STATUS: FULL CODE  Other plan as per orders.  I have spent a total of 55 min on this  admission  [name]  10/03/2012, 12:49 AM

## 2012-10-03 NOTE — Progress Notes (Signed)
Pt has had no urine output with no urge to void. Bladder scan showed 680cc in bladder. MD made aware. Julio Sicks RN

## 2012-10-03 NOTE — Progress Notes (Signed)
Nutrition Brief Note  Patient identified on the Malnutrition Screening Tool (MST) Report  Body mass index is 32.32 kg/(m^2). Patient meets criteria for Obesity based on current BMI. Pt reports that she usually weighs 200 lbs.   Current diet order is Carb Modified, patient is consuming approximately 90% of meals at this time per nursing notes although pt reported only eating 50%. Pt reports that she has had a decreased appetite for the past month and a half but, denies wt loss and states she continues to consume 3 meals daily at home. Pt states that she follows a diabetic diet and has a lot of support. Pt has no questions or concerns at this time. Encouraged po intake >/= 75% of 3 meals daily. Labs and medications reviewed.   Lab Results  Component Value Date   HGBA1C 5.4 08/05/2012    No nutrition interventions warranted at this time. If nutrition issues arise, please consult RD.  Ian Malkin RD, LDN Inpatient Clinical Dietitian Pager: [phone] After Hours Pager: [phone]

## 2012-10-03 NOTE — Progress Notes (Signed)
Pts O2 sats while ambulating on room air 79%. Ambulating on 3L O2 96% K. Vear Clock RN

## 2012-10-03 NOTE — ED Notes (Signed)
Called to give report nurse unavailable will call back.  

## 2012-10-03 NOTE — Progress Notes (Signed)
Patient unable to void. Day shift nurse reported patient had to be in and out catheterized twice. Bladder scanned patient and received 561 ml. Notified house coverage and received orders to insert foley catheter.

## 2012-10-03 NOTE — Progress Notes (Signed)
Patient rarely this morning. Seen after arrival to the floor. Comfortably. However, significantly deconditioned. Unsure if she can go back to assisted living. Awaiting physical and occupational therapy. Labs otherwise stable.

## 2012-10-03 NOTE — Evaluation (Signed)
Physical Therapy Evaluation Patient Details Name: Nichole Neyer MRN: [id number] DOB: 1934/08/22 Today's Date: 10/03/2012 Time: 4098-1191 PT Time Calculation (min): 17 min  PT Assessment / Plan / Recommendation Clinical Impression  Pt is a 78 year old female from ALF admitted for fall and hypoxemia with hx of hypertension, diabetes mellitus and schizophrenia.  Pt pleasant and cooperative however very fearful of falling.  Pt would benefit from acute PT services in order to improve independence with transfers and ambulation prior to d/c.  Pt may need ST-SNF if ALF unable to provide at least min assist.    PT Assessment  Patient needs continued PT services    Follow Up Recommendations  SNF    Does the patient have the potential to tolerate intense rehabilitation      Barriers to Discharge        Equipment Recommendations  None recommended by PT    Recommendations for Other Services     Frequency Min 3X/week    Precautions / Restrictions Precautions Precautions: Fall Precaution Comments: monitor sats   Pertinent Vitals/Pain SATURATION QUALIFICATIONS: (This note is used to comply with regulatory documentation for home oxygen)  Patient Saturations on Room Air at Rest = 93%  Patient Saturations on Room Air while Ambulating = 79%  Patient Saturations on 3 Liters of oxygen while Ambulating = 96%  Please briefly explain why patient needs home oxygen: To maintain saturations above 88% during ambulation      Mobility  Bed Mobility Bed Mobility: Rolling Left;Left Sidelying to Sit Rolling Left: 5: Supervision Left Sidelying to Sit: With rails;3: Mod assist Details for Bed Mobility Assistance: verbal cues for technique (reports back pain so used log roll) assist for trunk upright Transfers Transfers: Stand to Sit;Sit to Stand;Stand Pivot Transfers Sit to Stand: 4: Min assist;From chair/3-in-1;From bed Stand to Sit: 4: Min assist;To chair/3-in-1 Stand Pivot Transfers: 4: Min  assist Details for Transfer Assistance: verbal cues for safe technique, assist for rise and controlling descent, pt appears very anxious with mobility, used RW for pivot to [facility] Ambulation/Gait Ambulation/Gait Assistance: 4: Min assist Ambulation Distance (Feet): 14 Feet Assistive device: Rolling walker Ambulation/Gait Assistance Details: pt reporting increased SOB and feeling scared due to fall prior to admission so requested return to room Gait Pattern: Step-through pattern;Decreased stride length;Trunk flexed Gait velocity: decreased General Gait Details: SaO2 dropped to 79% room air so 3L O2 applied.    Exercises     PT Diagnosis: Difficulty walking  PT Problem List: Decreased strength;Decreased safety awareness;Decreased mobility;Decreased balance;Decreased activity tolerance;Decreased knowledge of use of DME;Obesity;Pain;Cardiopulmonary status limiting activity PT Treatment Interventions: DME instruction;Gait training;Stair training;Neuromuscular re-education;Balance training;Functional mobility training;Therapeutic activities;Therapeutic exercise;Patient/family education   PT Goals Acute Rehab PT Goals PT Goal Formulation: With patient Time For Goal Achievement: 10/17/12 Potential to Achieve Goals: Good Pt will go Supine/Side to Sit: with modified independence PT Goal: Supine/Side to Sit - Progress: Goal set today Pt will go Sit to Supine/Side: with modified independence PT Goal: Sit to Supine/Side - Progress: Goal set today Pt will go Sit to Stand: with supervision PT Goal: Sit to Stand - Progress: Goal set today Pt will go Stand to Sit: with supervision PT Goal: Stand to Sit - Progress: Goal set today Pt will Ambulate: 51 - 150 feet;with supervision;with rolling walker PT Goal: Ambulate - Progress: Goal set today Pt will Perform Home Exercise Program: with supervision, verbal cues required/provided PT Goal: Perform Home Exercise Program - Progress: Goal set today  Visit  Information  Last  PT Received On: 10/03/12 Assistance Needed: +2    Subjective Data  Subjective: I'm scared of falling. (increased anxiety with mobility)   Prior Functioning  Home Living Type of Home: Assisted living Additional Comments: Per chart from [facility] however pt states from home Prior Function Level of Independence: Independent with assistive device(s) Comments: Pt reports using RW for ambulation Communication Communication: No difficulties    Cognition  Cognition Arousal/Alertness: Awake/alert Behavior During Therapy: WFL for tasks assessed/performed Overall Cognitive Status: No family/caregiver present to determine baseline cognitive functioning (thought she came in from home (from ALF))    Extremity/Trunk Assessment Right Lower Extremity Assessment RLE ROM/Strength/Tone: Deficits RLE ROM/Strength/Tone Deficits: functional weakness observed Left Lower Extremity Assessment LLE ROM/Strength/Tone: Deficits LLE ROM/Strength/Tone Deficits: functional weakness observed   Balance    End of Session PT - End of Session Equipment Utilized During Treatment: Gait belt;Oxygen Activity Tolerance: Patient limited by fatigue Patient left: in chair;with call bell/phone within reach;with chair alarm set  GP Functional Assessment Tool Used: clinical judgement Functional Limitation: Mobility: Walking and moving around Mobility: Walking and Moving Around Current Status ([id number]): At least 20 percent but less than 40 percent impaired, limited or restricted Mobility: Walking and Moving Around Goal Status [phone]): At least 1 percent but less than 20 percent impaired, limited or restricted   Clemens Lachman,KATHrine E 10/03/2012, 3:25 PM Zenovia Jarred, PT, DPT 10/03/2012 Pager: [phone]

## 2012-10-04 ENCOUNTER — Inpatient Hospital Stay (HOSPITAL_COMMUNITY): Payer: Medicare Other

## 2012-10-04 DIAGNOSIS — I2699 Other pulmonary embolism without acute cor pulmonale: Secondary | ICD-10-CM | POA: Diagnosis present

## 2012-10-04 DIAGNOSIS — R0902 Hypoxemia: Secondary | ICD-10-CM

## 2012-10-04 DIAGNOSIS — R339 Retention of urine, unspecified: Secondary | ICD-10-CM | POA: Diagnosis present

## 2012-10-04 DIAGNOSIS — I5032 Chronic diastolic (congestive) heart failure: Secondary | ICD-10-CM | POA: Diagnosis present

## 2012-10-04 DIAGNOSIS — R5381 Other malaise: Secondary | ICD-10-CM | POA: Diagnosis present

## 2012-10-04 LAB — GLUCOSE, CAPILLARY
Glucose-Capillary: 99 mg/dL (ref 70–99)
Glucose-Capillary: 100 mg/dL — ABNORMAL HIGH (ref 70–99)

## 2012-10-04 LAB — D-DIMER, QUANTITATIVE: D-Dimer, Quant: 5.36 ug/mL-FEU — ABNORMAL HIGH (ref 0.00–0.48)

## 2012-10-04 MED ORDER — IOHEXOL 350 MG/ML SOLN
100.0000 mL | Freq: Once | INTRAVENOUS | Status: AC | PRN
  Administered 2012-10-04: 100 mL via INTRAVENOUS

## 2012-10-04 MED ORDER — RIVAROXABAN 15 MG PO TABS
15.0000 mg | ORAL_TABLET | Freq: Two times a day (BID) | ORAL | Status: DC
  Administered 2012-10-04 – 2012-10-08 (×8): 15 mg via ORAL
  Filled 2012-10-04 (×12): qty 1

## 2012-10-04 NOTE — Consult Note (Addendum)
Brief Note:  Unexplained hypoxia Episode of fall - no LOC Clear CXR and chest exam Very weak and lethargic/slow to answer Suspect chronic hypoventilation is likely explanation  Check D-dimer  If elevated, CTA chest to R/O PE RA ABG Too weak to perform PFTs  Full note to follow  Billy Fischer, MD ; [facility] service Mobile [phone].  After 5:30 PM or weekends, call [phone]

## 2012-10-04 NOTE — Consult Note (Addendum)
PULMONARY/CCM CONSULT NOTE  Requesting MD/Service: TRH/Krishnan Date of admission: 4/17 Date of consult: 4/18 Reason for consultation:  Unexplained hypoxemia    HPI:  12 F admitted from assisted living setting after fall. She describes it as her legs just getting weak and giving out. She denies full LOC. Noted to be hypoxic on RA without abnormalities on CXR to explain her hypoxemia. She denies DOE, PND, orthopnea, cough, CP, hemoptysis. She does have occasional ankle edema - none recently. Notably poor historian. No family or care providers available to provide further history. The current suspicion is that she became hypoxic which precipitated the fall   Past Medical History  Diagnosis Date  . Hypertension   . Diabetes mellitus   . Schizophrenia   . Hyperlipemia     MEDICATIONS:   History   Social History  . Marital Status: Divorced    Spouse Name: N/A    Number of Children: N/A  . Years of Education: N/A   Occupational History  . Not on file.   Social History Main Topics  . Smoking status: Never Smoker   . Smokeless tobacco: Never Used  . Alcohol Use: No  . Drug Use: No  . Sexually Active: No   Other Topics Concern  . Not on file   Social History Narrative  . No narrative on file    Family History  Problem Relation Age of Onset  . Heart disease Mother   . Kidney disease Father     ROS - As per HPI and otherwise negative. Specifically no fever, cough or any other respiratory symptoms  Filed Vitals:   10/04/12 0455 10/04/12 0501 10/04/12 1100 10/04/12 1333  BP:  138/90  125/69  Pulse: 87   88  Temp: 98.7 F (37.1 C)   97.3 F (36.3 C)  TempSrc: Oral   Oral  Resp: 18   20  Height:      Weight:      SpO2: 95%  90% 92%    EXAM:  Gen: Flat affect, slightly lethargic, NAD HEENT: EOMI, PERRL Neck: mild JVD, no LAN Lungs: clear anteriorly and posteriorly withotu adventitious sounds Cardiovascular: irreg with freq extrasystoles, no M noted Abdomen:  Moderately obese, soft, NT, NABS Ext: no edema, warm Neuro: CNs intact, motor and sens grossly intact, DTRs symmetric  DATA:  ABG    Component Value Date/Time   PHART 7.431 10/04/2012 1545   PCO2ART 44.6 10/04/2012 1545   PO2ART 52.6* 10/04/2012 1545   HCO3 29.2* 10/04/2012 1545   TCO2 25.3 10/04/2012 1545   O2SAT 87.5 10/04/2012 1545   CXR: NACPD  Echo: EF 60-65%. Grade 1 diastolic dysfunction. Mild PAH  IMPRESSION:   Unexplained hypoxemia  Fall without LOC   Disc: She has had recent hospitalization and does not appear to be very mobile @ baseline so she has RFs for VTE. In absence of other explanation for hypoxemia, I believe this needs to be ruled out.   PLAN/REC:  Discussed earlier with Dr Rito Ehrlich Check ABG on RA - done D Dimer - if elevated, proceed with CTA chest If no evidence of PE, consider Echo bubble study to R/O R>L shunt and PFTs   Billy Fischer, MD ; [facility] service Mobile [phone].  After 5:30 PM or weekends, call [phone]   ADD: D dimer elevated. CTA chest performed and is positive for PE  Billy Fischer, MD ; [facility] [phone].  After 5:30 PM or weekends, call [phone]

## 2012-10-04 NOTE — Progress Notes (Signed)
[facility] PROGRESS NOTE  [name] UJW:[id number] DOB: 02/10/1935 DOA: 10/02/2012 PCP: Leanor Rubenstein, MD  Assessment/Plan: Active Problems:   HTN (hypertension): Stable continue home meds    Diabetes: On sliding scale. Sugar stable.    Hypoxemia: Patient continues to have episodes of oxygen desaturation with any type of activity such as getting out of bed to chair. Echocardiogram noted grade 1 diastolic dysfunction, but repeat BNP is essentially normal. Have asked pulmonary for assistance.    Chronic diastolic heart failure: See above. New diagnosis, however is not felt to be cause at this time the patient's oxygen desaturations    Urinary retention: New problem since patient hospitalized. Likely medication related. Had discontinued Enablex. She's also on perphenazine for mood disorder. Hesitant to discontinue this, given the significant dose that she is on 3 times a day. Hopefully her urinary retention will improve.    Physical deconditioning: Patient will go from here to short-term skilled nursing before returning back to assisted living   Code Status: Full code  Family Communication: Spoke w/son by phone today for update  Disposition Plan: Will need to go to short-term skilled nursing (was at assisted living)   Consultants:  Pulmonary medicine  Procedures:  Echocardiogram done 4/17 noting grade 1 diastolic dysfunction with PA pressure of 34  Antibiotics:  None  HPI/Subjective: Patient doing okay. No complaints of shortness of breath when she is at rest. No chest pain complaints.  Objective: Filed Vitals:   10/04/12 0455 10/04/12 0501 10/04/12 1100 10/04/12 1333  BP:  138/90  125/69  Pulse: 87   88  Temp: 98.7 F (37.1 C)   97.3 F (36.3 C)  TempSrc: Oral   Oral  Resp: 18   20  Height:      Weight:      SpO2: 95%  90% 92%    Intake/Output Summary (Last 24 hours) at 10/04/12 1423 Last data filed at 10/04/12 1400  Gross per 24 hour  Intake    600  ml  Output   1450 ml  Net   -850 ml   Filed Weights   10/03/12 0248  Weight: 90.8 kg (200 lb 2.8 oz)    Exam:   General:  Alert and oriented x2, no acute distress  Cardiovascular: Regular rate and rhythm, S1-S2  Respiratory: Clear to auscultation bilaterally  Abdomen: Soft, nontender, nondistended, positive bowel sounds  Musculoskeletal: No clubbing or cyanosis or edema   Data Reviewed: Basic Metabolic Panel:  Recent Labs Lab 10/02/12 2300 10/03/12 0323  NA 136 133*  K 3.8 3.4*  CL 101 94*  CO2 26 32  GLUCOSE 107* 108*  BUN 9 9  CREATININE 0.70 0.75  CALCIUM 9.4 9.8  MG  --  1.6  PHOS  --  3.8   Liver Function Tests:  Recent Labs Lab 10/03/12 0323  AST 16  ALT 13  ALKPHOS 67  BILITOT 0.5  PROT 5.9*  ALBUMIN 3.1*   CBC:  Recent Labs Lab 10/02/12 2300 10/03/12 0323  WBC 8.0 8.9  NEUTROABS 5.3  --   HGB 15.7* 14.7  HCT 46.5* 44.7  MCV 95.5 96.5  PLT 163 171   Cardiac Enzymes:  Recent Labs Lab 10/03/12 0323 10/03/12 0800 10/03/12 1431  TROPONINI <0.30 <0.30 <0.30   BNP (last 3 results)  Recent Labs  09/23/12 2124 10/02/12 2300 10/04/12 1025  PROBNP 138.1 148.0 164.3   CBG:  Recent Labs Lab 10/03/12 1118 10/03/12 1657 10/03/12 2133 10/04/12 0712 10/04/12 1147  GLUCAP  137* 98 108* 99 100*     Studies: Dg Chest 2 View  10/02/2012    IMPRESSION: Minimal bibasilar opacities likely reflect atelectasis; lungs otherwise clear.  No displaced rib fractures seen.   Original Report Authenticated By: Tonia Ghent, M.D.    Ct Head Wo Contrast  10/02/2012   IMPRESSION: Scalp hematoma posteriorly.  No acute intracranial abnormality.   Original Report Authenticated By: Francene Boyers, M.D.     Scheduled Meds: . amantadine  100 mg Oral q morning - 10a  . antiseptic oral rinse  15 mL Mouth Rinse BID  . aspirin EC  81 mg Oral q morning - 10a  . atorvastatin  20 mg Oral QHS  . docusate sodium  100 mg Oral BID  . insulin aspart  0-5  Units Subcutaneous QHS  . insulin aspart  0-9 Units Subcutaneous TID WC  . lisinopril  10 mg Oral q morning - 10a  . mirtazapine  30 mg Oral QHS  . perphenazine  40 mg Oral QHS  . perphenazine  8 mg Oral Custom  . sodium chloride  3 mL Intravenous Q12H   Continuous Infusions:   Active Problems:   HTN (hypertension)   Diabetes   Hypoxemia   Chronic diastolic heart failure   Urinary retention   Physical deconditioning    Time spent: 25 minutes    Hollice Espy  [facility] Pager [phone]. If 7PM-7AM, please contact night-coverage at [email or web address], password [email or web address] 10/04/2012, 2:23 PM  LOS: 2 days

## 2012-10-04 NOTE — Evaluation (Signed)
Occupational Therapy Evaluation Patient Details Name: [name] MRN: [id number] DOB: February 19, 1935 Today's Date: 10/04/2012 Time: 4098-1191 OT Time Calculation (min): 23 min  OT Assessment / Plan / Recommendation Clinical Impression  This 78 year old female was admittted with hypoxemia.  She has a h/o HTN, DM and schizophrenia and is from ALF.  PLOF is unknown.  Pt did better with mobility yesterday with PT.  She is apropriate for skilled OT to increase independence and safety with adls and transfers to 3:1 commode.    OT Assessment  Patient needs continued OT Services    Follow Up Recommendations  SNF (Needs extensive assist with adls/transfers)    Barriers to Discharge      Equipment Recommendations  3 in 1 bedside comode    Recommendations for Other Services    Frequency  Min 2X/week    Precautions / Restrictions Precautions Precautions: Fall Precaution Comments: monitor sats Restrictions Weight Bearing Restrictions: No   Pertinent Vitals/Pain No pain report.  No sob/dyspnea    ADL  Grooming: Wash/dry hands;Set up (lotion) Where Assessed - Grooming: Supine, head of bed up Upper Body Bathing: Set up Where Assessed - Upper Body Bathing: Supine, head of bed up Lower Body Bathing: +2 Total assistance Lower Body Bathing: Patient Percentage: 20% Where Assessed - Lower Body Bathing: Rolling right and/or left Upper Body Dressing: Minimal assistance Where Assessed - Upper Body Dressing: Supine, head of bed up Lower Body Dressing: +2 Total assistance Lower Body Dressing: Patient Percentage: 10% Where Assessed - Lower Body Dressing: Rolling right and/or left Transfers/Ambulation Related to ADLs: assisted to eob with mod A.  Pt could not maintain static sitting, even with bil UE support.  Gave min to mod A--unable to release hand. ADL Comments: Assisted back to supine to further assess adls.   Yesterday, pt stood with PT:  could not maintain unsupported sitting with me, ?  fatique.  Will continue to assess with +2 help    OT Diagnosis: Generalized weakness  OT Problem List: Decreased strength;Decreased activity tolerance;Impaired balance (sitting and/or standing);Decreased safety awareness OT Treatment Interventions: Self-care/ADL training;DME and/or AE instruction;Therapeutic activities;Patient/family education;Balance training   OT Goals Acute Rehab OT Goals OT Goal Formulation: With patient Time For Goal Achievement: 10/18/12 Potential to Achieve Goals: Fair ADL Goals Pt Will Transfer to Toilet: with 2+ total assist;Stand pivot transfer;3-in-1 (pt 60%) ADL Goal: Toilet Transfer - Progress: Goal set today Miscellaneous OT Goals Miscellaneous OT Goal #1: Pt will sit unsupported EOB and perform UB adls with supervision OT Goal: Miscellaneous Goal #1 - Progress: Goal set today Miscellaneous OT Goal #2: Pt will stand with A x 2, pt 60% and maintain with min A x 2 minutes for adls OT Goal: Miscellaneous Goal #2 - Progress: Goal set today  Visit Information  Last OT Received On: 10/04/12 Assistance Needed: +2    Subjective Data  Subjective: I sat up in the chair:  I'm still pretty tired Patient Stated Goal: none stated but agreeable to OT   Prior Functioning     Home Living Type of Home: Assisted living Additional Comments: got different answers from from PT;  uncertain of set up, amount of assistance and PLOF Communication Communication: No difficulties         Vision/Perception     Cognition  Cognition Arousal/Alertness: Awake/alert Behavior During Therapy: WFL for tasks assessed/performed Overall Cognitive Status: No family/caregiver present to determine baseline cognitive functioning (different answers than those provided to PT)    Extremity/Trunk Assessment Right Upper  Extremity Assessment RUE ROM/Strength/Tone: Deficits RUE ROM/Strength/Tone Deficits: decreased strength bil, grossly 3 to 3+/5 Left Upper Extremity Assessment LUE  ROM/Strength/Tone: Deficits LUE ROM/Strength/Tone Deficits: see above     Mobility Bed Mobility Rolling Right: 4: Min assist;3: Mod assist Rolling Left: 4: Min assist;3: Mod assist Right Sidelying to Sit: With rails;2: Max assist Sitting - Scoot to Edge of Bed: 2: Max assist Details for Bed Mobility Assistance: vcs for technique     Exercise     Balance     End of Session OT - End of Session Activity Tolerance: Patient limited by fatigue Patient left: in bed;with call bell/phone within reach;with bed alarm set  GO     Gilberto Stanforth 10/04/2012, 3:18 PM Marica Otter, OTR/L [phone] 10/04/2012

## 2012-10-04 NOTE — Progress Notes (Signed)
Pt's O2 sats on 2L are 98-100%. O2 removed and sats remained 90-92% on room air at rest. O2 sats will drop on room air with activity. Will continue to monitor. Julio Sicks RN

## 2012-10-05 LAB — GLUCOSE, CAPILLARY
Glucose-Capillary: 109 mg/dL — ABNORMAL HIGH (ref 70–99)
Glucose-Capillary: 139 mg/dL — ABNORMAL HIGH (ref 70–99)

## 2012-10-05 NOTE — Progress Notes (Signed)
[facility] PROGRESS NOTE  [name] NWG:[id number] DOB: [birth year] DOA: 10/02/2012 PCP: Leanor Rubenstein, MD  Assessment/Plan: Active Problems:   HTN (hypertension): Stable continue home meds    Diabetes: On sliding scale. Sugar stable.  Pulmonary embolus: After discussion with pulmonary, d-dimer ordered and markedly elevated. Patient underwent CT angiogram of the bilateral pulmonary emboli. Started her on Xarelto.     Chronic diastolic heart failure: Stable. BNP within normal limits. Confirmed by echo.    Urinary retention: New problem since patient hospitalized. Likely medication related. Had discontinued Enablex. She's also on perphenazine for mood disorder. Hesitant to discontinue this, given the significant dose that she is on 3 times a day. Urinary retention starting to improve.    Physical deconditioning: Patient will go from here to short-term skilled nursing before returning back to assisted living   Code Status: Full code  Family Communication: Spoke w/son by phone today for update  Disposition Plan: Will need to go to short-term skilled nursing (was at assisted living), on Monday   Consultants:  Pulmonary medicine-signed off once pulmonary and was discovered  Procedures:  Echocardiogram done 4/17 noting grade 1 diastolic dysfunction with PA pressure of 34  Antibiotics:  None  HPI/Subjective: Doing okay. Feeling better. Sleeping comfortably.  Objective: Filed Vitals:   10/04/12 1100 10/04/12 1333 10/04/12 2228 10/05/12 0441  BP:  125/69 122/85 132/85  Pulse:  88 80 88  Temp:  97.3 F (36.3 C) 97.9 F (36.6 C) 98.2 F (36.8 C)  TempSrc:  Oral Oral Oral  Resp:  20 19 17   Height:      Weight:    91.2 kg (201 lb 1 oz)  SpO2: 90% 92% 92% 94%    Intake/Output Summary (Last 24 hours) at 10/05/12 1416 Last data filed at 10/05/12 1300  Gross per 24 hour  Intake    960 ml  Output   1100 ml  Net   -140 ml   Filed Weights   10/03/12 0248 10/05/12  0441  Weight: 90.8 kg (200 lb 2.8 oz) 91.2 kg (201 lb 1 oz)    Exam:   General:  Alert and oriented x2, no acute distress  Cardiovascular: Regular rate and rhythm, S1-S2  Respiratory: Clear to auscultation bilaterally  Abdomen: Soft, nontender, nondistended, positive bowel sounds  Musculoskeletal: No clubbing or cyanosis or edema   Data Reviewed: Basic Metabolic Panel:  Recent Labs Lab 10/02/12 2300 10/03/12 0323  NA 136 133*  K 3.8 3.4*  CL 101 94*  CO2 26 32  GLUCOSE 107* 108*  BUN 9 9  CREATININE 0.70 0.75  CALCIUM 9.4 9.8  MG  --  1.6  PHOS  --  3.8   Liver Function Tests:  Recent Labs Lab 10/03/12 0323  AST 16  ALT 13  ALKPHOS 67  BILITOT 0.5  PROT 5.9*  ALBUMIN 3.1*   CBC:  Recent Labs Lab 10/02/12 2300 10/03/12 0323  WBC 8.0 8.9  NEUTROABS 5.3  --   HGB 15.7* 14.7  HCT 46.5* 44.7  MCV 95.5 96.5  PLT 163 171   Cardiac Enzymes:  Recent Labs Lab 10/03/12 0323 10/03/12 0800 10/03/12 1431  TROPONINI <0.30 <0.30 <0.30   BNP (last 3 results)  Recent Labs  09/23/12 2124 10/02/12 2300 10/04/12 1025  PROBNP 138.1 148.0 164.3   CBG:  Recent Labs Lab 10/04/12 1147 10/04/12 1645 10/04/12 2242 10/05/12 0809 10/05/12 1140  GLUCAP 100* 88 130* 105* 124*     Studies: Dg Chest 2 View  10/02/2012    IMPRESSION: Minimal bibasilar opacities likely reflect atelectasis; lungs otherwise clear.  No displaced rib fractures seen.   Original Report Authenticated By: Tonia Ghent, M.D.    Ct Head Wo Contrast  10/02/2012   IMPRESSION: Scalp hematoma posteriorly.  No acute intracranial abnormality.   Original Report Authenticated By: Francene Boyers, M.D.     Scheduled Meds: . amantadine  100 mg Oral q morning - 10a  . antiseptic oral rinse  15 mL Mouth Rinse BID  . aspirin EC  81 mg Oral q morning - 10a  . atorvastatin  20 mg Oral QHS  . docusate sodium  100 mg Oral BID  . insulin aspart  0-5 Units Subcutaneous QHS  . insulin aspart   0-9 Units Subcutaneous TID WC  . lisinopril  10 mg Oral q morning - 10a  . mirtazapine  30 mg Oral QHS  . perphenazine  40 mg Oral QHS  . perphenazine  8 mg Oral Custom  . rivaroxaban  15 mg Oral BID WC  . sodium chloride  3 mL Intravenous Q12H   Continuous Infusions:   Active Problems:   HTN (hypertension)   Diabetes   Hypoxemia   Chronic diastolic heart failure   Urinary retention   Physical deconditioning   Pulmonary embolism    Time spent: 15 minutes    Hollice Espy  [facility] Pager [phone]. If 7PM-7AM, please contact night-coverage at [email or web address], password [facility] 10/05/2012, 2:16 PM  LOS: 3 days

## 2012-10-06 DIAGNOSIS — R339 Retention of urine, unspecified: Secondary | ICD-10-CM

## 2012-10-06 LAB — GLUCOSE, CAPILLARY
Glucose-Capillary: 105 mg/dL — ABNORMAL HIGH (ref 70–99)
Glucose-Capillary: 120 mg/dL — ABNORMAL HIGH (ref 70–99)
Glucose-Capillary: 103 mg/dL — ABNORMAL HIGH (ref 70–99)

## 2012-10-06 LAB — PROCALCITONIN: Procalcitonin: <0.1 ng/mL

## 2012-10-06 MED ORDER — FUROSEMIDE 20 MG PO TABS: 20.0000 mg | ORAL_TABLET | ORAL | Status: DC

## 2012-10-06 MED ORDER — RIVAROXABAN 20 MG PO TABS: 20.0000 mg | ORAL_TABLET | Freq: Every day | ORAL | Status: DC

## 2012-10-06 MED ORDER — CARVEDILOL 3.125 MG PO TABS: 3.1250 mg | ORAL_TABLET | Freq: Two times a day (BID) | ORAL | Status: DC

## 2012-10-06 MED ORDER — RIVAROXABAN 15 MG PO TABS: 15.0000 mg | ORAL_TABLET | Freq: Two times a day (BID) | ORAL | Status: DC

## 2012-10-06 NOTE — Discharge Summary (Signed)
Physician Discharge Summary  Yolandra Habig WUJ:[id number] DOB: 1934-11-18 DOA: 10/02/2012  PCP: Leanor Rubenstein, MD  Admit date: 10/02/2012 Discharge date: 10/06/2012  Time spent: 25 minutes  Recommendations for Outpatient Follow-up:  1. Patient will go from here to short-term skilled nursing before returning back to assisted living. 2. She will be started on Coreg and low-dose diuretic for new diagnosis of diastolic heart failure-currently stable 3. She has been started on Xarelto for anticoagulation    Discharge Condition: *Improved, being discharged to short-term skilled nursing before returning back to assisted living  Diet recommendation: Heart healthy carb modified  Filed Weights   10/03/12 0248 10/05/12 0441  Weight: 90.8 kg (200 lb 2.8 oz) 91.2 kg (201 lb 1 oz)    History of present illness:  Presented with  Patient have had a mechanical fall while walking to bathroom door. She does report feeling a bit confused after she hit her head. She is on Plavix. She hit her head on the floor. She was brought in to [facility] ER and was found to have posterior scalp hematoma not needing any stiches. CT of head showed no intracranial bleed. Incidentally she was found to be hypoxic on RA with O2 sat down to 86%. She was put on oxygen and improved. patient has no hx of requiring oxygen in the past. Denies any chest pain she does state that she gets short of breath sometimes but not currently. She never smoked patient is admitted for further evaluation of hypoxia. Currently she is sating 90% on RA.  Denies hx of sleep apnea. Of note patient has been admitted last week also after a fall and also was noted to be hypoxic on room air. At that time was thought to be secondary to sleep apnea since patient's oxygen saturation get worse when she was asleep.  I will   Hospital Course and Discharge Diagnoses:  Active Problems:   HTN (hypertension): Continue ACE inhibitor. See below. Beta blocker and diuretic  added.    Diabetes: Glucophage discontinued because of patient's confirmed diastolic heart failure. Sugars have been unremarkable an A1c in February 2014 was only 5.4. We'll not start a new agent here and can defer this to her primary care physician.    Hypoxemia: Secondary pulmonary embolus.    Chronic diastolic heart failure: Found on echocardiogram. BNP checked and found to be normal. Already on ACE inhibitor. Low dose Coreg and every other day diuretic added.    Urinary retention: Patient was noted to have urinary retention. In checking with her assisted living, this is a new problem. Patient herself had no feeling avoiding, but nursing noted she had not voided after 24-36 hours of hospitalization. Bladder scans have large amounts of urine with in and out catheterization, she passed large amount of urine. This continued to persist so Foley catheter was placed Reviewed medications and 2 biggest causes could be Enablex or Trilafon. Enablex discontinued on 4/18 and patient's Foley catheter was discontinued on 4/20. Hopefully she will start to void.  Mariah Milling was continued because patient is on large dose for behavior issues.    Physical deconditioning: Evaluated by PT and OT. With hypoxia, felt not to be strong enough to go back to assisted living and will need short-term skilled nursing first.    Pulmonary embolism: Principal problem. Patient presented to the emergency room after a fall and noted to be hypoxic. With ambulation in the hospital, confirmed on room air that she was quite hypoxic. Discuss with pulmonary edema number ordered  found markedly elevated. CT image of the chest confirmed bilateral PE. Patient started on Xarelto and with oxygen, has improved.   schizophrenia: Stable. Continued on home medicines.   Procedures: Echocardiogram done 4/17 noting grade 1 diastolic dysfunction with PA pressure of 34   Consultations:  Pulmonary  Discharge Exam: Filed Vitals:   10/05/12 2202  10/05/12 2300 10/05/12 2302 10/06/12 0430  BP:    123/83  Pulse:    78  Temp:    99.2 F (37.3 C)  TempSrc:    Oral  Resp:    20  Height:      Weight:      SpO2: 94% 75% 96% 93%    General: Alert and oriented x2 Cardiovascular: Regular rate and rhythm, S1-S2 Respiratory: Decreased breath sounds at the bases Abdomen: Soft, obese, nontender, positive bowel sounds Extremities: No clubbing or cyanosis, trace pitting edema  Discharge Instructions  Discharge Orders   Future Orders Complete By Expires     Diet - low sodium heart healthy  As directed     Diet Carb Modified  As directed     Increase activity slowly  As directed         Medication List    STOP taking these medications       darifenacin 7.5 MG 24 hr tablet  Commonly known as:  ENABLEX     metFORMIN 500 MG tablet  Commonly known as:  GLUCOPHAGE      TAKE these medications       amantadine 100 MG capsule  Commonly known as:  SYMMETREL  Take 100 mg by mouth every morning.     aspirin EC 81 MG tablet  Take 81 mg by mouth every morning.     atorvastatin 20 MG tablet  Commonly known as:  LIPITOR  Take 20 mg by mouth at bedtime.     BESIVANCE 0.6 % Susp  Generic drug:  Besifloxacin HCl  Place 1 drop into the right eye 2 (two) times daily.     carvedilol 3.125 MG tablet  Commonly known as:  COREG  Take 1 tablet (3.125 mg total) by mouth 2 (two) times daily with a meal.     DUREZOL 0.05 % Emul  Generic drug:  Difluprednate  Place 1 drop into the right eye 2 (two) times daily.     fish oil-omega-3 fatty acids 1000 MG capsule  Take 1 g by mouth 2 (two) times daily.     furosemide 20 MG tablet  Commonly known as:  LASIX  Take 1 tablet (20 mg total) by mouth every other day.     ILEVRO 0.3 % Susp  Generic drug:  Nepafenac  Place 1 drop into the right eye every morning.     lisinopril 10 MG tablet  Commonly known as:  PRINIVIL,ZESTRIL  Take 10 mg by mouth every morning.     mirtazapine 30 MG  tablet  Commonly known as:  REMERON  Take 30 mg by mouth at bedtime.     OSCAL 500/200 D-3 500-200 MG-UNIT per tablet  Generic drug:  calcium-vitamin D  Take 1 tablet by mouth 2 (two) times daily.     perphenazine 8 MG tablet  Commonly known as:  TRILAFON  Take 8-40 mg by mouth 3 (three) times daily. Take 1 tablet in the morning, 1 tablet at 5pm, and 5 tablets at bedtime     Rivaroxaban 15 MG Tabs tablet  Commonly known as:  XARELTO  Take 1 tablet (15  mg total) by mouth 2 (two) times daily with a meal.  Start taking on:  10/07/2012     Rivaroxaban 20 MG Tabs  Commonly known as:  XARELTO  Take 1 tablet (20 mg total) by mouth daily.  Start taking on:  10/27/2012           Follow-up Information   Follow up with Leanor Rubenstein, MD In 3 weeks.   Contact information:   [address] [place] [address] [phone]        The results of significant diagnostics from this hospitalization (including imaging, microbiology, ancillary and laboratory) are listed below for reference.    Significant Diagnostic Studies: Dg Chest 2 View  10/02/2012    IMPRESSION: Minimal bibasilar opacities likely reflect atelectasis; lungs otherwise clear.  No displaced rib fractures seen.   Original Report Authenticated By: Tonia Ghent, M.D.    Dg Chest 2 View  09/23/2012   IMPRESSION:  1.  Stable cardiac enlargement. 2.  Decreased pulmonary vascular congestion and small bilateral pleural effusions.  This may still represent an element of congestive heart failure. 3.  No significant airspace consolidation.   Original Report Authenticated By: Marin Roberts, M.D.    Ct Head Wo Contrast  10/02/2012   IMPRESSION: Scalp hematoma posteriorly.  No acute intracranial abnormality.   Original Report Authenticated By: Francene Boyers, M.D.    Ct Head Wo Contrast  09/23/2012   IMPRESSION:  1.  Stable atrophy and diffuse white matter disease. 2.  Right ethmoid sinus disease. 3.  No acute intracranial  abnormality.   Original Report Authenticated By: Marin Roberts, M.D.    Ct Angio Chest Pe W/cm &/or Wo Cm  10/04/2012    IMPRESSION: Segmental/subsegmental pulmonary emboli within the bilateral lower lobe pulmonary arteries.  Overall clot burden is small to moderate.  Critical Value/emergent results were called by telephone at the time of interpretation on 10/04/2012 at 1740 hours to Dr. Randel Pigg, who verbally acknowledged these results.   Original Report Authenticated By: Charline Bills, M.D.     Microbiology:    Labs: Basic Metabolic Panel:  Recent Labs Lab 10/02/12 2300 10/03/12 0323  NA 136 133*  K 3.8 3.4*  CL 101 94*  CO2 26 32  GLUCOSE 107* 108*  BUN 9 9  CREATININE 0.70 0.75  CALCIUM 9.4 9.8  MG  --  1.6  PHOS  --  3.8   Liver Function Tests:  Recent Labs Lab 10/03/12 0323  AST 16  ALT 13  ALKPHOS 67  BILITOT 0.5  PROT 5.9*  ALBUMIN 3.1*   CBC:  Recent Labs Lab 10/02/12 2300 10/03/12 0323  WBC 8.0 8.9  NEUTROABS 5.3  --   HGB 15.7* 14.7  HCT 46.5* 44.7  MCV 95.5 96.5  PLT 163 171   Cardiac Enzymes:  Recent Labs Lab 10/03/12 0323 10/03/12 0800 10/03/12 1431  TROPONINI <0.30 <0.30 <0.30   BNP: BNP (last 3 results)  Recent Labs  09/23/12 2124 10/02/12 2300 10/04/12 1025  PROBNP 138.1 148.0 164.3   CBG:  Recent Labs Lab 10/05/12 0809 10/05/12 1140 10/05/12 1636 10/05/12 2237 10/06/12 0744  GLUCAP 105* 124* 109* 139* 105*       Signed:  KRISHNAN,SENDIL K  [facility] 10/06/2012, 11:16 AM

## 2012-10-07 DIAGNOSIS — I10 Essential (primary) hypertension: Secondary | ICD-10-CM

## 2012-10-07 DIAGNOSIS — I5032 Chronic diastolic (congestive) heart failure: Secondary | ICD-10-CM

## 2012-10-07 DIAGNOSIS — E119 Type 2 diabetes mellitus without complications: Secondary | ICD-10-CM

## 2012-10-07 LAB — BLOOD GAS, ARTERIAL
Acid-Base Excess: 4.6 mmol/L — ABNORMAL HIGH (ref 0.0–2.0)
TCO2: 25.3 mmol/L (ref 0–100)
pCO2 arterial: 44.6 mmHg (ref 35.0–45.0)

## 2012-10-07 LAB — GLUCOSE, CAPILLARY
Glucose-Capillary: 111 mg/dL — ABNORMAL HIGH (ref 70–99)
Glucose-Capillary: 109 mg/dL — ABNORMAL HIGH (ref 70–99)

## 2012-10-07 MED ORDER — CARVEDILOL 3.125 MG PO TABS: 3.1250 mg | ORAL_TABLET | Freq: Two times a day (BID) | ORAL | Status: DC

## 2012-10-07 MED ORDER — CARVEDILOL 3.125 MG PO TABS
3.1250 mg | ORAL_TABLET | Freq: Two times a day (BID) | ORAL | Status: DC
  Administered 2012-10-08: 3.125 mg via ORAL
  Filled 2012-10-07 (×4): qty 1

## 2012-10-07 NOTE — Progress Notes (Signed)
Physical Therapy Treatment Patient Details Name: Joselin Crandell MRN: [id number] DOB: [birth year] Today's Date: 10/07/2012 Time: 4098-1191 PT Time Calculation (min): 28 min  PT Assessment / Plan / Recommendation Comments on Treatment Session  Pt req'd max encouragement to participate in therapy and appeared very anxious with movement. Pt ambulated on 2L O2 and sats remained at 96%.    Frequency Min 3X/week   Plan Discharge plan remains appropriate    Precautions / Restrictions Precautions Precautions: Fall Precaution Comments: monitor sats Restrictions Weight Bearing Restrictions: No   Pertinent Vitals/Pain Pt denied pain    Mobility  Bed Mobility Bed Mobility: Rolling Left;Left Sidelying to Sit Rolling Left: 3: Mod assist;2: Max assist Left Sidelying to Sit: 2: Max assist Sitting - Scoot to [place] of Bed: 2: Max assist Details for Bed Mobility Assistance: VC's for technique and reassurance Transfers Transfers: Sit to Stand;Stand to Sit Sit to Stand: 3: Mod assist;From bed Stand to Sit: 4: Min assist;To bed Details for Transfer Assistance: VC's for safe technique and proper hand placement, assist for rise and controlling decsent, pt appears very anxious with mobility Ambulation/Gait Ambulation/Gait Assistance: 4: Min assist Ambulation Distance (Feet): 20 Feet Assistive device: Rolling walker Ambulation/Gait Assistance Details: Pt req'd max encouragement to increase distance. She reported that she would like to return to her room. Gait Pattern: Step-through pattern;Decreased stride length;Trunk flexed Gait velocity: decreased General Gait Details: Pt ambulated on 2L O2 with sats remaining at 96%.      PT Goals Acute Rehab PT Goals PT Goal Formulation: With patient Time For Goal Achievement: 10/17/12 Potential to Achieve Goals: Good Pt will go Supine/Side to Sit: with modified independence PT Goal: Supine/Side to Sit - Progress: Progressing toward goal Pt will go Sit to  Stand: with supervision PT Goal: Sit to Stand - Progress: Progressing toward goal Pt will go Stand to Sit: with supervision PT Goal: Stand to Sit - Progress: Progressing toward goal Pt will Ambulate: 51 - 150 feet;with supervision;with rolling walker PT Goal: Ambulate - Progress: Progressing toward goal  Visit Information  Last PT Received On: 10/07/12 Assistance Needed: +2    Cognition    Fair-   Balance   Fair-  End of Session PT - End of Session Equipment Utilized During Treatment: Gait belt;Oxygen Activity Tolerance: Patient limited by fatigue Patient left: in bed;with call bell/phone within reach   GP     BROWN-SMEDLEY, NICOLE 10/07/2012, 4:25 PM  Felecia Shelling  PTA [facility]  Acute  Rehab Pager      [phone]

## 2012-10-07 NOTE — Progress Notes (Signed)
[facility] PROGRESS NOTE  [name] ZOX:[id number] DOB: 13-Mar-1935 DOA: 10/02/2012 PCP: Leanor Rubenstein, MD  Assessment/Plan: Active Problems:  HTN (hypertension): Continue ACE inhibitor. See below. Beta blocker Diabetes: Glucophage discontinued because of patient's confirmed diastolic heart failure. Sugars have been unremarkable an A1c in February 2014 was only 5.4. We'll not start a new agent here and can defer this to her primary care physician.  Hypoxemia: Secondary pulmonary embolus.  Chronic diastolic heart failure: Found on echocardiogram. BNP checked and found to be normal. Already on ACE inhibitor. Low dose Coreg and every other day diuretic added.  Urinary retention: Patient was noted to have urinary retention. In checking with her assisted living, this is a new problem. Patient herself had no feeling avoiding, but nursing noted she had not voided after 24-36 hours of hospitalization. Bladder scans have large amounts of urine with in and out catheterization, she passed large amount of urine. This continued to persist so Foley catheter was placed Reviewed medications and 2 biggest causes could be Enablex or Trilafon. Enablex discontinued on 4/18 and patient's Foley catheter was discontinued on 4/20. Hopefully she will start to void. Mariah Milling was continued because patient is on large dose for behavior issues.  -pt voiding spontaneously without foley now Physical deconditioning: Evaluated by PT and OT. With hypoxia, felt not to be strong enough to go back to assisted living and will need short-term skilled nursing first.  Pulmonary embolism: Principal problem. Patient presented to the emergency room after a fall and noted to be hypoxic. With ambulation in the hospital, confirmed on room air that she was quite hypoxic. Discuss with pulmonary and ordered D-dimer found markedly elevated. CT image of the chest confirmed bilateral PE. Patient started on Xarelto and with oxygen, has improved.   schizophrenia: Stable. Continued on home medicines.    Disposition Plan:  SNF in am 10/08/12 if stable        Procedures/Studies: Dg Chest 2 View  10/02/2012  *RADIOLOGY REPORT*  Clinical Data: Status post fall; cough.  Concern for chest injury.  CHEST - 2 VIEW  Comparison: Chest radiograph performed 09/23/2012  Findings: The lungs are relatively well-aerated.  Pulmonary vascularity is at the upper limits of normal.  Minimal bibasilar opacities likely reflect atelectasis.  No pleural effusion or pneumothorax is seen.  The heart is borderline normal in size.  No acute osseous abnormalities are seen.  A mild chronic compression deformity is noted at the upper lumbar spine.  IMPRESSION: Minimal bibasilar opacities likely reflect atelectasis; lungs otherwise clear.  No displaced rib fractures seen.   Original Report Authenticated By: Tonia Ghent, M.D.    Dg Chest 2 View  09/23/2012  *RADIOLOGY REPORT*  Clinical Data: Fall.  Shortness of breath.  CHEST - 2 VIEW  Comparison: Two-view chest 08/04/2012.  Findings: Cardiac enlargement is stable. Mild pulmonary vascular congestion is improved.  There is no frank edema.  There is blunting of the costophrenic sulci on the lateral view.  Small bilateral pleural effusions are suspected. No significant airspace consolidation is evident.  IMPRESSION:  1.  Stable cardiac enlargement. 2.  Decreased pulmonary vascular congestion and small bilateral pleural effusions.  This may still represent an element of congestive heart failure. 3.  No significant airspace consolidation.   Original Report Authenticated By: Marin Roberts, M.D.    Ct Head Wo Contrast  10/02/2012  *RADIOLOGY REPORT*  Clinical Data: Head trauma secondary to a fall.  Posterior scalp laceration.  CT HEAD WITHOUT CONTRAST  Technique:  Contiguous  axial images were obtained from the base of the skull through the vertex without contrast.  Comparison: 09/23/2012  Findings: There is no acute  intracranial hemorrhage, infarction, or mass lesion.  Scattered areas of periventricular white matter lucency consistent with small vessel ischemic changes, stable.  No acute osseous abnormality.  Prominent posterior scalp hematoma.  IMPRESSION: Scalp hematoma posteriorly.  No acute intracranial abnormality.   Original Report Authenticated By: Francene Boyers, M.D.    Ct Head Wo Contrast  09/23/2012  *RADIOLOGY REPORT*  Clinical Data: Fall.  Shortness of breath.  CT HEAD WITHOUT CONTRAST  Technique:  Contiguous axial images were obtained from the base of the skull through the vertex without contrast.  Comparison: CT head without and as 08/04/2012.  Findings: Atrophy and extensive white matter disease is not significantly changed.  No acute cortical infarct, hemorrhage, or mass lesion is present.  The ventricles are proportionate to the degree of atrophy.  No significant extra-axial fluid collection is present.  Secretions are present posteriorly in the right ethmoid air cells.  The paranasal sinuses and mastoid air cells are otherwise clear.  IMPRESSION:  1.  Stable atrophy and diffuse white matter disease. 2.  Right ethmoid sinus disease. 3.  No acute intracranial abnormality.   Original Report Authenticated By: Marin Roberts, M.D.    Ct Angio Chest Pe W/cm &/or Wo Cm  10/04/2012  *RADIOLOGY REPORT*  Clinical Data: Oxygen desaturation, confusion, elevated D-dimer  CT ANGIOGRAPHY CHEST  Technique:  Multidetector CT imaging of the chest using the standard protocol during bolus administration of intravenous contrast. Multiplanar reconstructed images including MIPs were obtained and reviewed to evaluate the vascular anatomy.  Contrast: OMNIPAQUE IOHEXOL 350 MG/ML SOLN  Comparison: Chest radiograph dated 10/02/2012  Findings: Segmental/subsegmental pulmonary emboli within branches of the bilateral lower lobe pulmonary arteries (series 7/images 157 and 173).  The overall clot burden is small to moderate.   Small right and trace left pleural effusions.  Associated lower lobe atelectasis.  No pneumothorax.  Visualized thyroid is unremarkable.  The heart is normal in size.  No pericardial effusion. Atherosclerotic calcifications of the aortic arch.  Visualized upper abdomen is notable for calcified splenic granulomata, prior cholecystectomy, and postsurgical prominence of the CBD measuring up to 10 mm.  IMPRESSION: Segmental/subsegmental pulmonary emboli within the bilateral lower lobe pulmonary arteries.  Overall clot burden is small to moderate.  Critical Value/emergent results were called by telephone at the time of interpretation on 10/04/2012 at 1740 hours to Dr. Randel Pigg, who verbally acknowledged these results.   Original Report Authenticated By: Charline Bills, M.D.          Subjective: Patient denies fevers, chills, chest pain, shortness breath, nausea, vomiting, diarrhea, abdominal pain, dysuria.  Objective: Filed Vitals:   10/06/12 2109 10/07/12 0619 10/07/12 0944 10/07/12 1406  BP: 112/70 130/88  150/99  Pulse: 72 85  80  Temp: 98 F (36.7 C) 97.5 F (36.4 C)  97.9 F (36.6 C)  TempSrc: Oral Oral  Oral  Resp: 20 18  18   Height:      Weight:  90.8 kg (200 lb 2.8 oz)    SpO2: 92% 88% 90% 90%    Intake/Output Summary (Last 24 hours) at 10/07/12 1852 Last data filed at 10/07/12 1300  Gross per 24 hour  Intake    960 ml  Output    250 ml  Net    710 ml   Weight change:  Exam:   General:  Pt is alert,  follows commands appropriately, not in acute distress  HEENT: No icterus, No thrush, [place]/AT  Cardiovascular: RRR, S1/S2, no rubs, no gallops  Respiratory: Diminished breath sounds at the bases. Air movement. No wheezes.  Abdomen: Soft/+BS, non tender, non distended, no guarding  Extremities: No edema, No lymphangitis, No petechiae, No rashes, no synovitis  Data Reviewed: Basic Metabolic Panel:  Recent Labs Lab 10/02/12 2300 10/03/12 0323  NA 136 133*   K 3.8 3.4*  CL 101 94*  CO2 26 32  GLUCOSE 107* 108*  BUN 9 9  CREATININE 0.70 0.75  CALCIUM 9.4 9.8  MG  --  1.6  PHOS  --  3.8   Liver Function Tests:  Recent Labs Lab 10/03/12 0323  AST 16  ALT 13  ALKPHOS 67  BILITOT 0.5  PROT 5.9*  ALBUMIN 3.1*   No results found for this basename: LIPASE, AMYLASE,  in the last 168 hours No results found for this basename: AMMONIA,  in the last 168 hours CBC:  Recent Labs Lab 10/02/12 2300 10/03/12 0323  WBC 8.0 8.9  NEUTROABS 5.3  --   HGB 15.7* 14.7  HCT 46.5* 44.7  MCV 95.5 96.5  PLT 163 171   Cardiac Enzymes:  Recent Labs Lab 10/03/12 0323 10/03/12 0800 10/03/12 1431  TROPONINI <0.30 <0.30 <0.30   BNP: No components found with this basename: POCBNP,  CBG:  Recent Labs Lab 10/06/12 1629 10/06/12 2147 10/07/12 0740 10/07/12 1228 10/07/12 1728  GLUCAP 120* 103* 115* 111* 109*    Recent Results (from the past 240 hour(s))  MRSA PCR SCREENING     Status: None   Collection Time    10/03/12  3:03 AM      Result Value Range Status   MRSA by PCR NEGATIVE  NEGATIVE Final   Comment:            The GeneXpert MRSA Assay (FDA     approved for NASAL specimens     only), is one component of a     comprehensive MRSA colonization     surveillance program. It is not     intended to diagnose MRSA     infection nor to guide or     monitor treatment for     MRSA infections.     Scheduled Meds: . amantadine  100 mg Oral q morning - 10a  . antiseptic oral rinse  15 mL Mouth Rinse BID  . aspirin EC  81 mg Oral q morning - 10a  . atorvastatin  20 mg Oral QHS  . docusate sodium  100 mg Oral BID  . insulin aspart  0-5 Units Subcutaneous QHS  . insulin aspart  0-9 Units Subcutaneous TID WC  . lisinopril  10 mg Oral q morning - 10a  . mirtazapine  30 mg Oral QHS  . perphenazine  40 mg Oral QHS  . perphenazine  8 mg Oral Custom  . rivaroxaban  15 mg Oral BID WC  . sodium chloride  3 mL Intravenous Q12H    Continuous Infusions:    Saad Buhl, DO  [facility] Pager [phone]  If 7PM-7AM, please contact night-coverage [email or web address] Password [facility] 10/07/2012, 6:52 PM   LOS: 5 days

## 2012-10-07 NOTE — Clinical Social Work Placement (Unsigned)
     Clinical Social Work Department CLINICAL SOCIAL WORK PLACEMENT NOTE 10/07/2012  Patient:  [name],[name]  Account Number:  [id number] Admit date:  10/02/2012  Clinical Social Worker:  Becky Sax, LCSW  Date/time:  10/07/2012 12:00 M  Clinical Social Work is seeking post-discharge placement for this patient at the following level of care:   SKILLED NURSING   (*CSW will update this form in Epic as items are completed)   10/07/2012  Patient/family provided with [facility] Department of Clinical Social Works list of facilities offering this level of care within the geographic area requested by the patient (or if unable, by the patients family).  10/07/2012  Patient/family informed of their freedom to choose among providers that offer the needed level of care, that participate in Medicare, Medicaid or managed care program needed by the patient, have an available bed and are willing to accept the patient.  10/07/2012  Patient/family informed of [facility] ownership interest in [facility], as well as of the fact that they are under no obligation to receive care at this facility.  PASARR submitted to EDS on 10/07/2012 PASARR number received from EDS on 10/07/2012  FL2 transmitted to all facilities in geographic area requested by pt/family on  10/07/2012 FL2 transmitted to all facilities within larger geographic area on   Patient informed that his/her managed care company has contracts with or will negotiate with  certain facilities, including the following:     Patient/family informed of bed offers received:  10/07/2012 Patient chooses bed at [facility] Physician recommends and patient chooses bed at    Patient to be transferred to [facility] on  10/08/2012 Patient to be transferred to facility by ptar  The following physician request were entered in Epic:   Additional Comments:

## 2012-10-07 NOTE — Care Management Note (Signed)
    Page 1 of 1   10/07/2012     3:56:09 PM   CARE MANAGEMENT NOTE 10/07/2012  Patient:  [name],[name]   Account Number:  [id number]  Date Initiated:  10/07/2012  Documentation initiated by:  Lanier Clam  Subjective/Objective Assessment:   ADMITTED W/SOB.HYPOXEMIA     Action/Plan:   FROM HOME   Anticipated DC Date:  10/08/2012   Anticipated DC Plan:  SKILLED NURSING FACILITY         Choice offered to / List presented to:             Status of service:  Completed, signed off Medicare Important Message given?   (If response is "NO", the following Medicare IM given date fields will be blank) Date Medicare IM given:   Date Additional Medicare IM given:    Discharge Disposition:  SKILLED NURSING FACILITY  Per UR Regulation:  Reviewed for med. necessity/level of care/duration of stay  If discussed at Long Length of Stay Meetings, dates discussed:    Comments:  10/07/12 Daylani Deblois RN,BSN NCM [phone] D/C SNF IN AM.

## 2012-10-07 NOTE — Progress Notes (Signed)
CSW submitted 30 day note. Khamila Bassinger C. Tawny Raspberry MSW, LCSW [phone]

## 2012-10-07 NOTE — Progress Notes (Signed)
CSW provided patient's son with bed offers. He is requesting [facility]. CSW spoke with [facility]. They can accept patient in AM.  Zayleigh Stroh C. Bekka Qian MSW, LCSW [phone]

## 2012-10-07 NOTE — Progress Notes (Signed)
Clinical Social Work Department BRIEF PSYCHOSOCIAL ASSESSMENT 10/07/2012  Patient:  [name],[name]     Account Number:  [id number]     Admit date:  10/02/2012  Clinical Social Worker:  Hattie Perch  Date/Time:  10/07/2012 12:00 M  Referred by:  Physician  Date Referred:  10/07/2012 Referred for  SNF Placement   Other Referral:   Interview type:  Family Other interview type:    PSYCHOSOCIAL DATA Living Status:  FACILITY Admitted from facility:  [facility] [place] Level of care:  Assisted Living Primary support name:  daniel hassell Primary support relationship to patient:  CHILD, ADULT Degree of support available:   fair    CURRENT CONCERNS Current Concerns  Post-Acute Placement   Other Concerns:    SOCIAL WORK ASSESSMENT / PLAN CSW spoke with patient's son. discussed need for snf placement. he is agreeable to patient being faxed out and recieving bed offers.   Assessment/plan status:   Other assessment/ plan:   Information/referral to community resources:    PATIENT'S/FAMILY'S RESPONSE TO PLAN OF CARE: agreeable to patient being faxed out and recieving bed offers.

## 2012-10-08 LAB — GLUCOSE, CAPILLARY: Glucose-Capillary: 105 mg/dL — ABNORMAL HIGH (ref 70–99)

## 2012-10-08 LAB — BASIC METABOLIC PANEL
BUN: 11 mg/dL (ref 6–23)
CO2: 30 mEq/L (ref 19–32)
Calcium: 9 mg/dL (ref 8.4–10.5)
Creatinine, Ser: 0.66 mg/dL (ref 0.50–1.10)
GFR calc non Af Amer: 84 mL/min — ABNORMAL LOW (ref >90)
Glucose, Bld: 100 mg/dL — ABNORMAL HIGH (ref 70–99)
Sodium: 139 mEq/L (ref 135–145)

## 2012-10-08 MED ORDER — CARVEDILOL 3.125 MG PO TABS: 3.1250 mg | ORAL_TABLET | Freq: Two times a day (BID) | ORAL | Status: AC

## 2012-10-08 NOTE — Progress Notes (Signed)
Received report from Melinda. Will be assuming care of patient, agree with documented assessment. J.Kalliopi Coupland,RN  

## 2012-10-08 NOTE — Progress Notes (Signed)
Physical Therapy Treatment Patient Details Name: [name] MRN: [id number] DOB: 04/23/1935 Today's Date: 10/08/2012 Time: 4098-1191 PT Time Calculation (min): 25 min  PT Assessment / Plan / Recommendation Comments on Treatment Session  Pt required encouragement to ambulate however reported feeling good being up.  Pt also performed a couple exercises in the recliner.  Pt to d/c today.    Follow Up Recommendations  SNF     Does the patient have the potential to tolerate intense rehabilitation     Barriers to Discharge        Equipment Recommendations  None recommended by PT    Recommendations for Other Services    Frequency     Plan Discharge plan remains appropriate;Frequency remains appropriate    Precautions / Restrictions Precautions Precautions: Fall Precaution Comments: monitor sats   Pertinent Vitals/Pain Pt denies SOB    Mobility  Bed Mobility Supine to Sit: HOB elevated;4: Min assist Details for Bed Mobility Assistance: pt attempted to perform without assist however needed assist for trunk upright Transfers Transfers: Sit to Stand;Stand to Sit;Stand Pivot Transfers Sit to Stand: 4: Min assist Stand to Sit: 4: Min assist Stand Pivot Transfers: 4: Min assist Details for Transfer Assistance: verbal cues for safe technique, assist to rise and control descent, min/guard upon second time, stand pivot to [facility] prior to ambulation Ambulation/Gait Ambulation/Gait Assistance: 4: Min guard Ambulation Distance (Feet): 80 Feet Assistive device: Rolling walker Ambulation/Gait Assistance Details: verbal cues for safe use of RW, ambulated on 1L with SaO2 96% upon return to room Gait Pattern: Step-through pattern;Trunk flexed;Decreased stride length  Increased time with transfers.  Required assist for hygiene   Exercises General Exercises - Lower Extremity Ankle Circles/Pumps: AROM;20 reps;Both Heel Slides: AROM;Both;5 reps;Supine Hip ABduction/ADduction:  AROM;Both;Supine;15 reps Straight Leg Raises: AROM;Both;10 reps;Supine   PT Diagnosis:    PT Problem List:   PT Treatment Interventions:     PT Goals Acute Rehab PT Goals PT Goal: Supine/Side to Sit - Progress: Progressing toward goal PT Goal: Sit to Stand - Progress: Progressing toward goal PT Goal: Stand to Sit - Progress: Progressing toward goal PT Goal: Ambulate - Progress: Progressing toward goal  Visit Information  Last PT Received On: 10/08/12 Assistance Needed: +2    Subjective Data  Subjective: I'm going home today.   Cognition  Cognition Arousal/Alertness: Awake/alert Behavior During Therapy: WFL for tasks assessed/performed    Balance     End of Session PT - End of Session Equipment Utilized During Treatment: Gait belt;Oxygen Activity Tolerance: Patient limited by fatigue Patient left: in chair;with call bell/phone within reach;with chair alarm set   GP     Alzada Brazee,KATHrine E 10/08/2012, 10:26 AM Zenovia Jarred, PT, DPT 10/08/2012 Pager: [phone]

## 2012-10-08 NOTE — Discharge Summary (Signed)
Physician Discharge Summary  [name]:[id number] DOB: Jan 02, 1935 DOA: 10/02/2012  PCP: Charlotte Rubenstein, MD  Admit date: 10/02/2012 Discharge date: 10/08/2012  Recommendations for Outpatient Follow-up:  1. Pt will need to follow up with Dr. Marcy Davis on Oct 21, 2012 at 8:30AM  post discharge 2. Please obtain BMP to evaluate electrolytes and kidney function 3. Please also check CBC to evaluate Hg and Hct levels   Discharge Diagnoses:  Active Problems:   HTN (hypertension)   Diabetes   Hypoxemia   Chronic diastolic heart failure   Urinary retention   Physical deconditioning   Pulmonary embolism HTN (hypertension): Continue ACE inhibitor. See below. Beta blocker  Diabetes: Glucophage discontinued because of patient's confirmed diastolic heart failure. Sugars have been unremarkable an A1c in February 2014 was only 5.4. We'll not start a new agent here and can defer this to her primary care physician.  Hypoxemia: Secondary pulmonary embolus.  -has gradually improved -oxygen sat 90% on room air with ambulation Chronic diastolic heart failure: Found on echocardiogram. BNP checked and found to be normal. Already on ACE inhibitor. Low dose Coreg and every other day diuretic added.  Urinary retention: Patient was noted to have urinary retention. In checking with her assisted living, this is a new problem. Patient herself had no feeling avoiding, but nursing noted she had not voided after 24-36 hours of hospitalization. Bladder scans have large amounts of urine with in and out catheterization, she passed large amount of urine. This continued to persist so Foley catheter was placed Reviewed medications and 2 biggest causes could be Enablex or Trilafon. Enablex discontinued on 4/18 and patient's Foley catheter was discontinued on 4/20. Hopefully she will start to void. [name] was continued because patient is on large dose for behavior issues.  -pt voiding spontaneously without foley now  Physical  deconditioning: Evaluated by PT and OT. With hypoxia, felt not to be strong enough to go back to assisted living and will need short-term skilled nursing first.  Pulmonary embolism: Principal problem. Patient presented to the emergency room after a fall and noted to be hypoxic. With ambulation in the hospital, confirmed on room air that she was quite hypoxic. Discuss with pulmonary and ordered D-dimer found markedly elevated. CT image of the chest confirmed bilateral PE. Patient started on Xarelto and with oxygen, has improved.  schizophrenia: Stable. Continued on home medicines.    Discharge Condition: Stable  Disposition:  Follow-up Information   Follow up with Charlotte Rubenstein, MD Today. (Oct 21, 2012@8 :30AM)    Contact information:   [address] [place] [address] [phone]       Diet:carbohydrate modifiec Wt Readings from Last 3 Encounters:  10/08/12 90.3 kg (199 lb 1.2 oz)  08/05/12 99 kg (218 lb 4.1 oz)    History of present illness:  Patient have had a mechanical fall while walking to bathroom door. She does report feeling a bit confused after she hit her head. She is on Plavix. She hit her head on the floor. She was brought in to [facility] ER and was found to have posterior scalp hematoma not needing any stiches. CT of head showed no intracranial bleed. Incidentally she was found to be hypoxic on RA with O2 sat down to 86%. She was put on oxygen and improved. patient has no hx of requiring oxygen in the past. Denies any chest pain she does state that she gets short of breath sometimes but not currently. She never smoked patient is admitted for further  evaluation of hypoxia. Currently she is sating 90% on RA.  Denies hx of sleep apnea. Of note patient has been admitted last week also after a fall and also was noted to be hypoxic on room air. At that time was thought to be secondary to sleep apnea since patient's oxygen saturation get worse when she was asleep.      Consultants: Pulmonary  Discharge Exam: Filed Vitals:   10/08/12 0820  BP: 152/106  Pulse: 87  Temp:   Resp:    Filed Vitals:   10/08/12 0607 10/08/12 0645 10/08/12 0658 10/08/12 0820  BP: 145/92   152/106  Pulse: 87   87  Temp: 98.8 F (37.1 C)     TempSrc: Oral     Resp: 19     Height:      Weight: 90.3 kg (199 lb 1.2 oz)     SpO2: 92% 85% 95%    General:  NAD, pleasant, cooperative Cardiovascular: RRR, no rub, no gallop, no S3 Respiratory: CTAB no wheeze Abdomen:soft, nontender, nondistended, positive bowel sounds Extremities: No edema, No lymphangitis, no petechiae  Discharge Instructions      Discharge Orders   Future Orders Complete By Expires     Diet - low sodium heart healthy  As directed     Diet - low sodium heart healthy  As directed     Diet Carb Modified  As directed     Discharge instructions  As directed     Comments:      Rivaroxaban 15mg  twice a day through 10/26/12 Start Rivaroxaban 20mg  daily on 10/27/12    Increase activity slowly  As directed     Increase activity slowly  As directed         Medication List    STOP taking these medications       darifenacin 7.5 MG 24 hr tablet  Commonly known as:  ENABLEX     metFORMIN 500 MG tablet  Commonly known as:  GLUCOPHAGE      TAKE these medications       amantadine 100 MG capsule  Commonly known as:  SYMMETREL  Take 100 mg by mouth every morning.     aspirin EC 81 MG tablet  Take 81 mg by mouth every morning.     atorvastatin 20 MG tablet  Commonly known as:  LIPITOR  Take 20 mg by mouth at bedtime.     BESIVANCE 0.6 % Susp  Generic drug:  Besifloxacin HCl  Place 1 drop into the right eye 2 (two) times daily.     carvedilol 3.125 MG tablet  Commonly known as:  COREG  Take 1 tablet (3.125 mg total) by mouth 2 (two) times daily with a meal.     DUREZOL 0.05 % Emul  Generic drug:  Difluprednate  Place 1 drop into the right eye 2 (two) times daily.     fish  oil-omega-3 fatty acids 1000 MG capsule  Take 1 g by mouth 2 (two) times daily.     furosemide 20 MG tablet  Commonly known as:  LASIX  Take 1 tablet (20 mg total) by mouth every other day.     ILEVRO 0.3 % Susp  Generic drug:  Nepafenac  Place 1 drop into the right eye every morning.     lisinopril 10 MG tablet  Commonly known as:  PRINIVIL,ZESTRIL  Take 10 mg by mouth every morning.     mirtazapine 30 MG tablet  Commonly known as:  REMERON  Take 30 mg by mouth at bedtime.     OSCAL 500/200 D-3 500-200 MG-UNIT per tablet  Generic drug:  calcium-vitamin D  Take 1 tablet by mouth 2 (two) times daily.     perphenazine 8 MG tablet  Commonly known as:  TRILAFON  Take 8-40 mg by mouth 3 (three) times daily. Take 1 tablet in the morning, 1 tablet at 5pm, and 5 tablets at bedtime     Rivaroxaban 15 MG Tabs tablet  Commonly known as:  XARELTO  Take 1 tablet (15 mg total) by mouth 2 (two) times daily with a meal.     Rivaroxaban 20 MG Tabs  Commonly known as:  XARELTO  Take 1 tablet (20 mg total) by mouth daily.  Start taking on:  10/27/2012         The results of significant diagnostics from this hospitalization (including imaging, microbiology, ancillary and laboratory) are listed below for reference.    Significant Diagnostic Studies: Dg Chest 2 View  10/02/2012  *RADIOLOGY REPORT*  Clinical Data: Status post fall; cough.  Concern for chest injury.  CHEST - 2 VIEW  Comparison: Chest radiograph performed 09/23/2012  Findings: The lungs are relatively well-aerated.  Pulmonary vascularity is at the upper limits of normal.  Minimal bibasilar opacities likely reflect atelectasis.  No pleural effusion or pneumothorax is seen.  The heart is borderline normal in size.  No acute osseous abnormalities are seen.  A mild chronic compression deformity is noted at the upper lumbar spine.  IMPRESSION: Minimal bibasilar opacities likely reflect atelectasis; lungs otherwise clear.  No displaced  rib fractures seen.   Original Report Authenticated By: Tonia Ghent, M.D.    Dg Chest 2 View  09/23/2012  *RADIOLOGY REPORT*  Clinical Data: Fall.  Shortness of breath.  CHEST - 2 VIEW  Comparison: Two-view chest 08/04/2012.  Findings: Cardiac enlargement is stable. Mild pulmonary vascular congestion is improved.  There is no frank edema.  There is blunting of the costophrenic sulci on the lateral view.  Small bilateral pleural effusions are suspected. No significant airspace consolidation is evident.  IMPRESSION:  1.  Stable cardiac enlargement. 2.  Decreased pulmonary vascular congestion and small bilateral pleural effusions.  This may still represent an element of congestive heart failure. 3.  No significant airspace consolidation.   Original Report Authenticated By: Marin Roberts, M.D.    Ct Head Wo Contrast  10/02/2012  *RADIOLOGY REPORT*  Clinical Data: Head trauma secondary to a fall.  Posterior scalp laceration.  CT HEAD WITHOUT CONTRAST  Technique:  Contiguous axial images were obtained from the base of the skull through the vertex without contrast.  Comparison: 09/23/2012  Findings: There is no acute intracranial hemorrhage, infarction, or mass lesion.  Scattered areas of periventricular white matter lucency consistent with small vessel ischemic changes, stable.  No acute osseous abnormality.  Prominent posterior scalp hematoma.  IMPRESSION: Scalp hematoma posteriorly.  No acute intracranial abnormality.   Original Report Authenticated By: Francene Boyers, M.D.    Ct Head Wo Contrast  09/23/2012  *RADIOLOGY REPORT*  Clinical Data: Fall.  Shortness of breath.  CT HEAD WITHOUT CONTRAST  Technique:  Contiguous axial images were obtained from the base of the skull through the vertex without contrast.  Comparison: CT head without and as 08/04/2012.  Findings: Atrophy and extensive white matter disease is not significantly changed.  No acute cortical infarct, hemorrhage, or mass lesion is present.   The ventricles are proportionate to the degree of atrophy.  No significant extra-axial fluid collection is present.  Secretions are present posteriorly in the right ethmoid air cells.  The paranasal sinuses and mastoid air cells are otherwise clear.  IMPRESSION:  1.  Stable atrophy and diffuse white matter disease. 2.  Right ethmoid sinus disease. 3.  No acute intracranial abnormality.   Original Report Authenticated By: Marin Roberts, M.D.    Ct Angio Chest Pe W/cm &/or Wo Cm  10/04/2012  *RADIOLOGY REPORT*  Clinical Data: Oxygen desaturation, confusion, elevated D-dimer  CT ANGIOGRAPHY CHEST  Technique:  Multidetector CT imaging of the chest using the standard protocol during bolus administration of intravenous contrast. Multiplanar reconstructed images including MIPs were obtained and reviewed to evaluate the vascular anatomy.  Contrast: OMNIPAQUE IOHEXOL 350 MG/ML SOLN  Comparison: Chest radiograph dated 10/02/2012  Findings: Segmental/subsegmental pulmonary emboli within branches of the bilateral lower lobe pulmonary arteries (series 7/images 157 and 173).  The overall clot burden is small to moderate.  Small right and trace left pleural effusions.  Associated lower lobe atelectasis.  No pneumothorax.  Visualized thyroid is unremarkable.  The heart is normal in size.  No pericardial effusion. Atherosclerotic calcifications of the aortic arch.  Visualized upper abdomen is notable for calcified splenic granulomata, prior cholecystectomy, and postsurgical prominence of the CBD measuring up to 10 mm.  IMPRESSION: Segmental/subsegmental pulmonary emboli within the bilateral lower lobe pulmonary arteries.  Overall clot burden is small to moderate.  Critical Value/emergent results were called by telephone at the time of interpretation on 10/04/2012 at 1740 hours to Dr. Randel Pigg, who verbally acknowledged these results.   Original Report Authenticated By: Charline Bills, M.D.       Microbiology: Recent Results (from the past 240 hour(s))  MRSA PCR SCREENING     Status: None   Collection Time    10/03/12  3:03 AM      Result Value Range Status   MRSA by PCR NEGATIVE  NEGATIVE Final   Comment:            The GeneXpert MRSA Assay (FDA     approved for NASAL specimens     only), is one component of a     comprehensive MRSA colonization     surveillance program. It is not     intended to diagnose MRSA     infection nor to guide or     monitor treatment for     MRSA infections.     Labs: Basic Metabolic Panel:  Recent Labs Lab 10/02/12 2300 10/03/12 0323 10/08/12 0523  NA 136 133* 139  K 3.8 3.4* 3.9  CL 101 94* 101  CO2 26 32 30  GLUCOSE 107* 108* 100*  BUN 9 9 11   CREATININE 0.70 0.75 0.66  CALCIUM 9.4 9.8 9.0  MG  --  1.6  --   PHOS  --  3.8  --    Liver Function Tests:  Recent Labs Lab 10/03/12 0323  AST 16  ALT 13  ALKPHOS 67  BILITOT 0.5  PROT 5.9*  ALBUMIN 3.1*   No results found for this basename: LIPASE, AMYLASE,  in the last 168 hours No results found for this basename: AMMONIA,  in the last 168 hours CBC:  Recent Labs Lab 10/02/12 2300 10/03/12 0323  WBC 8.0 8.9  NEUTROABS 5.3  --   HGB 15.7* 14.7  HCT 46.5* 44.7  MCV 95.5 96.5  PLT 163 171   Cardiac Enzymes:  Recent Labs Lab 10/03/12 0323 10/03/12 0800 10/03/12  1431  TROPONINI <0.30 <0.30 <0.30   BNP: No components found with this basename: POCBNP,  CBG:  Recent Labs Lab 10/07/12 0740 10/07/12 1228 10/07/12 1728 10/07/12 2220 10/08/12 0800  GLUCAP 115* 111* 109* 122* 105*    Time coordinating discharge:  Greater than 30 minutes  Signed:  Aubra Pappalardo, DO [facility] Pager: [phone] 10/08/2012, 9:56 AM

## 2012-10-08 NOTE — Progress Notes (Signed)
Patient discharged to [facility] nursing home via EMS. Alert and denies any distress. No pressure ulcer noted, left knee abrasion scabbed over . Posterior head hematoma from fall prior to admission. Discharge packet prepared by CSW and given to EMS.

## 2012-10-08 NOTE — Progress Notes (Signed)
Clinical Social Work  CSW faxed DC summary to [facility] and SNF is agreeable to admission. CSW informed patient and RN and left a message with patient's son regarding dc plans. CSW prepared DC packet and coordinated transportation via [facility]. (Service Request # [id number]). CSW is signing off but available if further needs arise.  Unk Lightning, LCSW (Coverage for [employer])

## 2012-10-10 ENCOUNTER — Encounter: Payer: Self-pay | Admitting: Nurse Practitioner

## 2012-10-10 ENCOUNTER — Non-Acute Institutional Stay (SKILLED_NURSING_FACILITY): Payer: Medicare Other | Admitting: Nurse Practitioner

## 2012-10-10 DIAGNOSIS — E119 Type 2 diabetes mellitus without complications: Secondary | ICD-10-CM

## 2012-10-10 DIAGNOSIS — R339 Retention of urine, unspecified: Secondary | ICD-10-CM

## 2012-10-10 DIAGNOSIS — R0902 Hypoxemia: Secondary | ICD-10-CM

## 2012-10-10 DIAGNOSIS — I10 Essential (primary) hypertension: Secondary | ICD-10-CM

## 2012-10-10 DIAGNOSIS — R5381 Other malaise: Secondary | ICD-10-CM

## 2012-10-10 DIAGNOSIS — I2699 Other pulmonary embolism without acute cor pulmonale: Secondary | ICD-10-CM

## 2012-10-10 DIAGNOSIS — I5032 Chronic diastolic (congestive) heart failure: Secondary | ICD-10-CM

## 2012-10-10 NOTE — Assessment & Plan Note (Signed)
Stable- pt still reports shortness of breath with activity currently on 2L [place] with sats at 96% will cont xarelto

## 2012-10-10 NOTE — Assessment & Plan Note (Signed)
Patient is stable; continue current regimen. Will monitor and make changes as necessary. Cont current medications

## 2012-10-10 NOTE — Progress Notes (Signed)
Patient ID: [name], female   DOB: [birth year], 78 y.o.   MRN: [id number]   PCP: Leanor Rubenstein, MD  Code Status: full code  No Known Allergies  Chief Complaint: follow up hospitalization  HPI:  Patient is a 78 year old female who had a mechanical fall where she hit her head. She was then brought in to [facility] ER and was found to have posterior scalp hematoma not needing any stiches. CT of head showed no intracranial bleed. She was found to be hypoxic on RA with O2 sat down to 86%. She was put on oxygen it was found that she had PE. Pts D-dimer found markedly elevated  CT image of the chest confirmed bilateral PE and was placed on xarelto with O2  HTN (hypertension): stable on current medications Diabetes: Glucophage was discontinued because of patient's confirmed diastolic heart failure. Sugars have been unremarkable an A1c in February 2014 was only 5.4.  Chronic diastolic heart failure: Found on echocardiogram. BNP checked and found to be normal. Currently on ACE inhibitor, coreg and every other day diuretic added.   Urinary retention: Patient was noted to have urinary retention in hospital however after discontinuation of enablex this has resolved and she is now able to urinate  schizophrenia:  Was continued on home medicines.  Physical deconditioning: Evaluated by PT and OT. With hypoxia, felt not to be strong enough to go back to assisted living therefore she was sent to [facility] for short term rehab  Review of Systems:  Review of Systems  Constitutional: Negative for fever and chills.  Respiratory: Positive for shortness of breath. Negative for wheezing.   Cardiovascular: Negative for chest pain and leg swelling.  Gastrointestinal: Negative for heartburn, abdominal pain, diarrhea and constipation.  Genitourinary: Negative for dysuria and urgency.  Musculoskeletal: Negative for myalgias and back pain.  Skin: Negative.   Neurological: Positive for weakness.  Psychiatric/Behavioral:        Hx of schizophrenia      Past Medical History  Diagnosis Date  . Hypertension   . Diabetes mellitus   . Schizophrenia   . Hyperlipemia    Past Surgical History  Procedure Laterality Date  . Cholecystectomy    . Abdominal hysterectomy     Social History:   reports that she has never smoked. She has never used smokeless tobacco. She reports that she does not drink alcohol or use illicit drugs.  Family History  Problem Relation Age of Onset  . Heart disease Mother   . Kidney disease Father     Medications: Patient's Medications  New Prescriptions   No medications on file  Previous Medications   AMANTADINE (SYMMETREL) 100 MG CAPSULE    Take 100 mg by mouth every morning.    ASPIRIN EC 81 MG TABLET    Take 81 mg by mouth every morning.    ATORVASTATIN (LIPITOR) 20 MG TABLET    Take 20 mg by mouth at bedtime.   BESIFLOXACIN HCL (BESIVANCE) 0.6 % SUSP    Place 1 drop into the right eye 2 (two) times daily.    CALCIUM-VITAMIN D (OSCAL 500/200 D-3) 500-200 MG-UNIT PER TABLET    Take 1 tablet by mouth 2 (two) times daily.   CARVEDILOL (COREG) 3.125 MG TABLET    Take 1 tablet (3.125 mg total) by mouth 2 (two) times daily with a meal.   DIFLUPREDNATE (DUREZOL) 0.05 % EMUL    Place 1 drop into the right eye 2 (two) times daily.  FISH OIL-OMEGA-3 FATTY ACIDS 1000 MG CAPSULE    Take 1 g by mouth 2 (two) times daily.    FUROSEMIDE (LASIX) 20 MG TABLET    Take 1 tablet (20 mg total) by mouth every other day.   LISINOPRIL (PRINIVIL,ZESTRIL) 10 MG TABLET    Take 10 mg by mouth every morning.    MIRTAZAPINE (REMERON) 30 MG TABLET    Take 30 mg by mouth at bedtime.   NEPAFENAC (ILEVRO) 0.3 % SUSP    Place 1 drop into the right eye every morning.    PERPHENAZINE (TRILAFON) 8 MG TABLET    Take 8-40 mg by mouth 3 (three) times daily. Take 1 tablet in the morning, 1 tablet at 5pm, and 5 tablets at bedtime   RIVAROXABAN (XARELTO) 15 MG TABS TABLET    Take 1 tablet (15 mg total) by mouth 2  (two) times daily with a meal.   RIVAROXABAN (XARELTO) 20 MG TABS    Take 1 tablet (20 mg total) by mouth daily.  Modified Medications   No medications on file  Discontinued Medications   No medications on file     Physical Exam:  Filed Vitals:   10/10/12 1652  BP: 139/96  Pulse: 81  Temp: 96.7 F (35.9 C)  Resp: 18  SpO2: 96%   Physical Exam  Constitutional: She is well-developed, well-nourished, and in no distress. No distress.  HENT:  Head: Normocephalic and atraumatic.  Eyes: Conjunctivae and EOM are normal. Pupils are equal, round, and reactive to light.  Cardiovascular: Normal rate and normal heart sounds.   Pulmonary/Chest: Effort normal and breath sounds normal.  Abdominal: Soft. Bowel sounds are normal.  Musculoskeletal: She exhibits no edema and no tenderness.  Currently in wheelchair able to move all extremities   Neurological: She is alert.  Skin: Skin is warm and dry. She is not diaphoretic.      Labs reviewed: Basic Metabolic Panel:  Recent Labs  16/10/96 2300 10/03/12 0323 10/08/12 0523  NA 136 133* 139  K 3.8 3.4* 3.9  CL 101 94* 101  CO2 26 32 30  GLUCOSE 107* 108* 100*  BUN 9 9 11   CREATININE 0.70 0.75 0.66  CALCIUM 9.4 9.8 9.0  MG  --  1.6  --   PHOS  --  3.8  --    Liver Function Tests:  Recent Labs  08/04/12 1916 08/05/12 0411 10/03/12 0323  AST 23 24 16   ALT 15 15 13   ALKPHOS 57 52 67  BILITOT 0.2* 0.2* 0.5  PROT 6.2 5.5* 5.9*  ALBUMIN 2.9* 2.6* 3.1*   No results found for this basename: LIPASE, AMYLASE,  in the last 8760 hours No results found for this basename: AMMONIA,  in the last 8760 hours CBC:  Recent Labs  05/23/12 0412 08/04/12 1916  09/23/12 2124 10/02/12 2300 10/03/12 0323  WBC 7.2 9.8  < > 6.9 8.0 8.9  NEUTROABS 4.4 7.8*  --   --  5.3  --   HGB 14.6 14.2  < > 15.1* 15.7* 14.7  HCT 44.4 42.5  < > 45.5 46.5* 44.7  MCV 98.0 98.4  < > 97.4 95.5 96.5  PLT 185 160  < > 158 163 171  < > = values in this  interval not displayed. Cardiac Enzymes:  Recent Labs  10/03/12 0323 10/03/12 0800 10/03/12 1431  TROPONINI <0.30 <0.30 <0.30  CBG:  Recent Labs  10/07/12 1728 10/07/12 2220 10/08/12 0800  GLUCAP 109* 122* 105*  Significant  Exams: Dg Chest 2 View  10/02/2012 *RADIOLOGY REPORT* Clinical Data: Status post fall; cough. Concern for chest injury. CHEST - 2 VIEW Comparison: Chest radiograph performed 09/23/2012 Findings: The lungs are relatively well-aerated. Pulmonary vascularity is at the upper limits of normal. Minimal bibasilar opacities likely reflect atelectasis. No pleural effusion or pneumothorax is seen. The heart is borderline normal in size. No acute osseous abnormalities are seen. A mild chronic compression deformity is noted at the upper lumbar spine. IMPRESSION: Minimal bibasilar opacities likely reflect atelectasis; lungs otherwise clear. No displaced rib fractures seen. Original Report Authenticated By: Tonia Ghent, M.D.  Dg Chest 2 View  09/23/2012 *RADIOLOGY REPORT* Clinical Data: Fall. Shortness of breath. CHEST - 2 VIEW Comparison: Two-view chest 08/04/2012. Findings: Cardiac enlargement is stable. Mild pulmonary vascular congestion is improved. There is no frank edema. There is blunting of the costophrenic sulci on the lateral view. Small bilateral pleural effusions are suspected. No significant airspace consolidation is evident. IMPRESSION: 1. Stable cardiac enlargement. 2. Decreased pulmonary vascular congestion and small bilateral pleural effusions. This may still represent an element of congestive heart failure. 3. No significant airspace consolidation. Original Report Authenticated By: Marin Roberts, M.D.  Ct Head Wo Contrast  10/02/2012 *RADIOLOGY REPORT* Clinical Data: Head trauma secondary to a fall. Posterior scalp laceration. CT HEAD WITHOUT CONTRAST Technique: Contiguous axial images were obtained from the base of the skull through the vertex without  contrast. Comparison: 09/23/2012 Findings: There is no acute intracranial hemorrhage, infarction, or mass lesion. Scattered areas of periventricular white matter lucency consistent with small vessel ischemic changes, stable. No acute osseous abnormality. Prominent posterior scalp hematoma. IMPRESSION: Scalp hematoma posteriorly. No acute intracranial abnormality. Original Report Authenticated By: Francene Boyers, M.D.  Ct Head Wo Contrast  09/23/2012 *RADIOLOGY REPORT* Clinical Data: Fall. Shortness of breath. CT HEAD WITHOUT CONTRAST Technique: Contiguous axial images were obtained from the base of the skull through the vertex without contrast. Comparison: CT head without and as 08/04/2012. Findings: Atrophy and extensive white matter disease is not significantly changed. No acute cortical infarct, hemorrhage, or mass lesion is present. The ventricles are proportionate to the degree of atrophy. No significant extra-axial fluid collection is present. Secretions are present posteriorly in the right ethmoid air cells. The paranasal sinuses and mastoid air cells are otherwise clear. IMPRESSION: 1. Stable atrophy and diffuse white matter disease. 2. Right ethmoid sinus disease. 3. No acute intracranial abnormality. Original Report Authenticated By: Marin Roberts, M.D.  Ct Angio Chest Pe W/cm &/or Wo Cm  10/04/2012 *RADIOLOGY REPORT* Clinical Data: Oxygen desaturation, confusion, elevated D-dimer CT ANGIOGRAPHY CHEST Technique: Multidetector CT imaging of the chest using the standard protocol during bolus administration of intravenous contrast. Multiplanar reconstructed images including MIPs were obtained and reviewed to evaluate the vascular anatomy. Contrast: OMNIPAQUE IOHEXOL 350 MG/ML SOLN Comparison: Chest radiograph dated 10/02/2012 Findings: Segmental/subsegmental pulmonary emboli within branches of the bilateral lower lobe pulmonary arteries (series 7/images 157 and 173). The overall clot burden is  small to moderate. Small right and trace left pleural effusions. Associated lower lobe atelectasis. No pneumothorax. Visualized thyroid is unremarkable. The heart is normal in size. No pericardial effusion. Atherosclerotic calcifications of the aortic arch. Visualized upper abdomen is notable for calcified splenic granulomata, prior cholecystectomy, and postsurgical prominence of the CBD measuring up to 10 mm. IMPRESSION: Segmental/subsegmental pulmonary emboli within the bilateral lower lobe pulmonary arteries. Overall clot burden is small to moderate. Critical Value/emergent results were called by telephone at the time of interpretation on  10/04/2012 at 1740 hours to Dr. Randel Pigg, who verbally acknowledged these results. Original Report Authenticated By: Charline Bills, M.D.      Assessment/Plan Pulmonary embolism Stable- pt still reports shortness of breath with activity currently on 2L [place] with sats at 96% will cont xarelto  Diabetes Off all medications- will cont to monitor and make changes as necessary   Chronic diastolic heart failure Patient is stable; continue current regimen. Will monitor and make changes as necessary. Cont current medications   HTN (hypertension) Patient is stable; continue current regimen. Will monitor and make changes as necessary.   Urinary retention Stable for now- will have staff closely monitor for retention  Physical deconditioning Here for short term therapy and will be discharged back to assisted living once she regains strength   Hypoxemia Will cont oxygen at this time and cont to monitor oxygen saturation      Labs/tests ordered Will follow up cbc and bmp on 10/14/12

## 2012-10-10 NOTE — Assessment & Plan Note (Signed)
Patient is stable; continue current regimen. Will monitor and make changes as necessary.  

## 2012-10-10 NOTE — Assessment & Plan Note (Signed)
Will cont oxygen at this time and cont to monitor oxygen saturation

## 2012-10-10 NOTE — Assessment & Plan Note (Signed)
Off all medications- will cont to monitor and make changes as necessary

## 2012-10-10 NOTE — Assessment & Plan Note (Signed)
Stable for now- will have staff closely monitor for retention

## 2012-10-10 NOTE — Assessment & Plan Note (Signed)
Here for short term therapy and will be discharged back to assisted living once she regains strength

## 2012-11-15 ENCOUNTER — Encounter: Payer: Self-pay | Admitting: Nurse Practitioner

## 2012-11-15 ENCOUNTER — Non-Acute Institutional Stay (SKILLED_NURSING_FACILITY): Payer: Medicare Other | Admitting: Nurse Practitioner

## 2012-11-15 DIAGNOSIS — I10 Essential (primary) hypertension: Secondary | ICD-10-CM

## 2012-11-15 DIAGNOSIS — R5381 Other malaise: Secondary | ICD-10-CM

## 2012-11-15 DIAGNOSIS — E119 Type 2 diabetes mellitus without complications: Secondary | ICD-10-CM

## 2012-11-15 DIAGNOSIS — I5032 Chronic diastolic (congestive) heart failure: Secondary | ICD-10-CM

## 2012-11-15 DIAGNOSIS — I2699 Other pulmonary embolism without acute cor pulmonale: Secondary | ICD-10-CM

## 2012-11-15 DIAGNOSIS — F319 Bipolar disorder, unspecified: Secondary | ICD-10-CM | POA: Insufficient documentation

## 2012-11-15 NOTE — Progress Notes (Signed)
Patient ID: [name], female   DOB: [birth year], 78 y.o.   MRN: [id number]  Nursing Home Location:  [facility]   Place of Service: SNF (31)   Chief Complaint: medical management of chronic conditions   HPI:   Patient is a 78 year old female with a PMH of HTN, DM, CHF and psychosis is at [facility] due to deconditioning after hospitalization due to PE and hypoxemia, pt still on oxygen and staff has attempted to wean her off but has been unsuccessful due to sats dropping below 88%. Pt is currently without complaints and staff does not report concerns at this time.   Review of Systems:  Review of Systems  Constitutional: Negative for malaise/fatigue.  Respiratory: Negative for cough, sputum production, shortness of breath and wheezing.   Cardiovascular: Negative for chest pain.  Gastrointestinal: Negative for heartburn, diarrhea, constipation and blood in stool.  Genitourinary: Negative for dysuria.  Musculoskeletal: Negative for myalgias and joint pain.  Skin: Negative.   Neurological: Negative for dizziness and weakness.  Psychiatric/Behavioral: Positive for memory loss. The patient does not have insomnia.      Medications: Patient's Medications  New Prescriptions   No medications on file  Previous Medications   AMANTADINE (SYMMETREL) 100 MG CAPSULE    Take 100 mg by mouth every morning.    ASPIRIN EC 81 MG TABLET    Take 81 mg by mouth every morning.    ATORVASTATIN (LIPITOR) 20 MG TABLET    Take 20 mg by mouth at bedtime.   BESIFLOXACIN HCL (BESIVANCE) 0.6 % SUSP    Place 1 drop into the right eye 2 (two) times daily.    CALCIUM-VITAMIN D (OSCAL 500/200 D-3) 500-200 MG-UNIT PER TABLET    Take 1 tablet by mouth 2 (two) times daily.   CARVEDILOL (COREG) 3.125 MG TABLET    Take 1 tablet (3.125 mg total) by mouth 2 (two) times daily with a meal.   DIFLUPREDNATE (DUREZOL) 0.05 % EMUL    Place 1 drop into the right eye 2 (two) times daily.    FISH OIL-OMEGA-3 FATTY  ACIDS 1000 MG CAPSULE    Take 1 g by mouth 2 (two) times daily.    FUROSEMIDE (LASIX) 20 MG TABLET    Take 1 tablet (20 mg total) by mouth every other day.   LISINOPRIL (PRINIVIL,ZESTRIL) 10 MG TABLET    Take 10 mg by mouth every morning.    MIRTAZAPINE (REMERON) 30 MG TABLET    Take 30 mg by mouth at bedtime.   NEPAFENAC (ILEVRO) 0.3 % SUSP    Place 1 drop into the right eye every morning.    PERPHENAZINE (TRILAFON) 8 MG TABLET    Take 16 mg by mouth at bedtime.    RIVAROXABAN (XARELTO) 20 MG TABS    Take 1 tablet (20 mg total) by mouth daily.  Modified Medications   No medications on file  Discontinued Medications   RIVAROXABAN (XARELTO) 15 MG TABS TABLET    Take 1 tablet (15 mg total) by mouth 2 (two) times daily with a meal.     Physical Exam:  Filed Vitals:   11/15/12 1608  BP: 107/79  Pulse: 70  Temp: 97.6 F (36.4 C)  Resp: 16    Physical Exam  Constitutional: She appears well-developed and well-nourished. No distress.  HENT:  Head: Normocephalic and atraumatic.  Cardiovascular: Normal rate, regular rhythm and normal heart sounds.   Pulmonary/Chest: Effort normal and breath sounds normal. No respiratory  distress.  Abdominal: Soft. Bowel sounds are normal. She exhibits no distension. There is no tenderness.  Skin: Skin is warm and dry. She is not diaphoretic.  Psychiatric: She is not slowed. Cognition and memory are impaired.     Labs reviewed: Basic Metabolic Panel       Result: 10/23/2012 3:10 PM    ( Status: F )            Sodium  144        135-145  mEq/L  SLN       Potassium  3.8        3.5-5.3  mEq/L  SLN       Chloride  105        96-112  mEq/L  SLN       CO2  31        19-32  mEq/L  SLN       Glucose  93        70-99  mg/dL  SLN       BUN  10        6-23  mg/dL  SLN       Creatinine  0.66        0.50-1.10  mg/dL  SLN       Calcium  8.6        CBC with Diff       Result: 10/14/2012 2:27 PM    ( Status: F )       C     WBC  8.0        4.0-10.5  K/uL  SLN        RBC  4.26        3.87-5.11  MIL/uL  SLN       Hemoglobin  13.6        12.0-15.0  g/dL  SLN       Hematocrit  40.0        36.0-46.0  %  SLN       MCV  93.9        78.0-100.0  fL  SLN       MCH  31.9        26.0-34.0  pg  SLN       MCHC  34.0        30.0-36.0  g/dL  SLN       RDW  45.4        11.5-15.5  %  SLN       Platelet Count  274        150-400  K/uL  SLN       Granulocyte %  64        43-77  %  SLN       Absolute Gran  5.1        1.7-7.7  K/uL  SLN       Lymph %  25        12-46  %  SLN       Absolute Lymph  2.0        0.7-4.0  K/uL  SLN       Mono %  8        3-12  %  SLN       Absolute Mono  0.7        0.1-1.0  K/uL  SLN       Eos %  3        0-5  %  SLN       Absolute Eos  0.3        0.0-0.7  K/uL  SLN       Baso %  0        0-1  %  SLN       Absolute Baso  0.0        0.0-0.1  K/uL  SLN       Smear Review  Criteria for review not met   SLN      Basic Metabolic Panel       Result: 10/14/2012 3:10 PM    ( Status: F )            Sodium  143        135-145  mEq/L  SLN       Potassium  3.5        3.5-5.3  mEq/L  SLN       Chloride  102        96-112  mEq/L  SLN       CO2  33     H  19-32  mEq/L  SLN       Glucose  100     H  70-99  mg/dL  SLN       BUN  9        6-23  mg/dL  SLN       Creatinine  0.67        0.50-1.10  mg/dL  SLN       Calcium  9.3     Assessment/Plan Pulmonary embolism Pt is still unable to be weaned off O2; taking xarelto 20mg  daily   Chronic diastolic heart failure Patients heart failure  is stable; continue current regimen. Will monitor and make changes as necessary.   HTN (hypertension) Patients HTN is stable; continue current regimen. Will monitor and make changes as necessary.   Diabetes Stable Will follow A1c was dc'd from all medications last month  Bipolar disorder, unspecified Recent dose reduction in perphenazine- pt has tolerated change well - no behaviors noted- will cont to monitor  Physical deconditioning conts to need to work  with therapy

## 2012-11-15 NOTE — Assessment & Plan Note (Signed)
Stable Will follow A1c was dc'd from all medications last month

## 2012-11-15 NOTE — Assessment & Plan Note (Signed)
Recent dose reduction in perphenazine- pt has tolerated change well - no behaviors noted- will cont to monitor

## 2012-11-15 NOTE — Assessment & Plan Note (Signed)
Patients HTN is stable; continue current regimen. Will monitor and make changes as necessary.  

## 2012-11-15 NOTE — Assessment & Plan Note (Signed)
Patients heart failure  is stable; continue current regimen. Will monitor and make changes as necessary.

## 2012-11-15 NOTE — Assessment & Plan Note (Signed)
Pt is still unable to be weaned off O2; taking xarelto 20mg  daily

## 2012-11-15 NOTE — Assessment & Plan Note (Signed)
conts to need to work with therapy

## 2012-12-13 ENCOUNTER — Encounter: Payer: Self-pay | Admitting: Nurse Practitioner

## 2012-12-13 ENCOUNTER — Non-Acute Institutional Stay (SKILLED_NURSING_FACILITY): Payer: Medicare Other | Admitting: Nurse Practitioner

## 2012-12-13 DIAGNOSIS — I10 Essential (primary) hypertension: Secondary | ICD-10-CM

## 2012-12-13 DIAGNOSIS — F319 Bipolar disorder, unspecified: Secondary | ICD-10-CM

## 2012-12-13 DIAGNOSIS — R0902 Hypoxemia: Secondary | ICD-10-CM

## 2012-12-13 DIAGNOSIS — R5381 Other malaise: Secondary | ICD-10-CM

## 2012-12-13 NOTE — Progress Notes (Signed)
Patient ID: [name], female   DOB: 10/11/1934, 78 y.o.   MRN: [id number]  Nursing Home Location:  [facility]   Place of Service: SNF (31)   Chief Complaint: medical management of chronic conditions  HPI:  Patient is a 78 year old female with a PMH of HTN, DM, CHF and psychosis is at [facility] for short term rehab. Staff reports pt is doing well and there is no concerns at this time conts to work with therapy. Pt without complaints at this time.     Review of Systems:  Review of Systems  Constitutional: Negative for fever and chills.  HENT: Negative.   Eyes: Negative for blurred vision, pain, discharge and redness.  Respiratory: Negative for cough and shortness of breath.   Cardiovascular: Negative for chest pain.  Gastrointestinal: Negative for abdominal pain, diarrhea and constipation.  Genitourinary: Negative for dysuria, urgency and frequency.  Musculoskeletal: Negative for myalgias.  Neurological: Negative for dizziness and weakness.  Psychiatric/Behavioral: Negative for depression. The patient is not nervous/anxious and does not have insomnia.     Medications: Patient's Medications  New Prescriptions   No medications on file  Previous Medications   AMANTADINE (SYMMETREL) 100 MG CAPSULE    Take 100 mg by mouth every morning.    ASPIRIN EC 81 MG TABLET    Take 81 mg by mouth every morning.    ATORVASTATIN (LIPITOR) 20 MG TABLET    Take 20 mg by mouth at bedtime.   BESIFLOXACIN HCL (BESIVANCE) 0.6 % SUSP    Place 1 drop into the right eye 2 (two) times daily.    CALCIUM-VITAMIN D (OSCAL 500/200 D-3) 500-200 MG-UNIT PER TABLET    Take 1 tablet by mouth 2 (two) times daily.   CARVEDILOL (COREG) 3.125 MG TABLET    Take 1 tablet (3.125 mg total) by mouth 2 (two) times daily with a meal.   DIFLUPREDNATE (DUREZOL) 0.05 % EMUL    Place 1 drop into the right eye 2 (two) times daily.    FISH OIL-OMEGA-3 FATTY ACIDS 1000 MG CAPSULE    Take 1 g by mouth 2 (two) times  daily.    FUROSEMIDE (LASIX) 20 MG TABLET    Take 1 tablet (20 mg total) by mouth every other day.   LISINOPRIL (PRINIVIL,ZESTRIL) 10 MG TABLET    Take 10 mg by mouth every morning.    MIRTAZAPINE (REMERON) 30 MG TABLET    Take 30 mg by mouth at bedtime.   NEPAFENAC (ILEVRO) 0.3 % SUSP    Place 1 drop into the right eye every morning.    PERPHENAZINE (TRILAFON) 8 MG TABLET    Take 16 mg by mouth at bedtime.    RIVAROXABAN (XARELTO) 20 MG TABS    Take 1 tablet (20 mg total) by mouth daily.  Modified Medications   No medications on file  Discontinued Medications   No medications on file     Physical Exam:  Filed Vitals:   12/13/12 1207  BP: 118/77  Pulse: 77  Temp: 97.7 F (36.5 C)  Resp: 20   Physical Exam  Constitutional: She is well-developed, well-nourished, and in no distress. No distress.  HENT:  Head: Normocephalic and atraumatic.  Eyes: Conjunctivae and EOM are normal. Pupils are equal, round, and reactive to light.  Neck: Normal range of motion. Neck supple.  Cardiovascular: Normal rate, regular rhythm and normal heart sounds.   Pulmonary/Chest: Effort normal and breath sounds normal. No respiratory distress.  Abdominal:  Soft. Bowel sounds are normal.  Musculoskeletal: She exhibits no edema and no tenderness.  Skin: Skin is warm and dry. She is not diaphoretic.      Labs reviewed: 10/23/2012 3:10 PM   Sodium  144        135-145  mEq/L  SLN       Potassium  3.8        3.5-5.3  mEq/L  SLN       Chloride  105        96-112  mEq/L  SLN       CO2  31        19-32  mEq/L  SLN       Glucose  93        70-99  mg/dL  SLN       BUN  10        6-23  mg/dL  SLN       Creatinine  0.66        0.50-1.10  mg/dL  SLN       Calcium  8.6        8.4-10.5    CBC with Diff       Result: 10/14/2012 2:27 PM    ( Status: F )       C     WBC  8.0        4.0-10.5  K/uL  SLN       RBC  4.26        3.87-5.11  MIL/uL  SLN       Hemoglobin  13.6        12.0-15.0  g/dL  SLN        Hematocrit  40.0        36.0-46.0  %  SLN       MCV  93.9        78.0-100.0  fL  SLN       MCH  31.9        26.0-34.0  pg  SLN       MCHC  34.0        30.0-36.0  g/dL  SLN       RDW  14.7        11.5-15.5  %  SLN       Platelet Count  274        150-400  K/uL  SLN       Granulocyte %  64        43-77  %  SLN       Absolute Gran  5.1        1.7-7.7  K/uL  SLN       Lymph %  25        12-46  %  SLN       Absolute Lymph  2.0        0.7-4.0  K/uL  SLN       Mono %  8        3-12  %  SLN       Absolute Mono  0.7        0.1-1.0  K/uL  SLN       Eos %  3        0-5  %  SLN       Absolute Eos  0.3        0.0-0.7  K/uL  SLN       Baso %  0  0-1  %  SLN       Absolute Baso  0.0        0.0-0.1  K/uL  SLN       Smear Review  Criteria for review not met   SLN      Basic Metabolic Panel       Result: 10/14/2012 3:10 PM    ( Status: F )            Sodium  143        135-145  mEq/L  SLN       Potassium  3.5        3.5-5.3  mEq/L  SLN       Chloride  102        96-112  mEq/L  SLN       CO2  33     H  19-32  mEq/L  SLN       Glucose  100     H  70-99  mg/dL  SLN       BUN  9        6-23  mg/dL  SLN       Creatinine  0.67        0.50-1.10  mg/dL  SLN       Calcium  9.3        8.4-10.5  mg/dL  SLN      Assessment/Plan   1.   Bipolar disorder, unspecified 296.80     Mood has been Stable on current medications- will cont current plan of care   2.    Physical deconditioning 799.3     conts with therapies   3.   Hypoxemia 799.02     Remains on oxygen- unable to wean at this time   4.   HTN (hypertension)   Stable on current medications  5.  pt on Rx eye drops without complaints of pain, redness or inflammation; will stop at this time and cont to monitor

## 2013-01-09 ENCOUNTER — Encounter: Payer: Self-pay | Admitting: Nurse Practitioner

## 2013-01-09 ENCOUNTER — Non-Acute Institutional Stay (SKILLED_NURSING_FACILITY): Payer: Medicare Other | Admitting: Nurse Practitioner

## 2013-01-09 DIAGNOSIS — I2699 Other pulmonary embolism without acute cor pulmonale: Secondary | ICD-10-CM

## 2013-01-09 DIAGNOSIS — I10 Essential (primary) hypertension: Secondary | ICD-10-CM

## 2013-01-09 DIAGNOSIS — R0902 Hypoxemia: Secondary | ICD-10-CM

## 2013-01-09 DIAGNOSIS — E119 Type 2 diabetes mellitus without complications: Secondary | ICD-10-CM

## 2013-01-09 DIAGNOSIS — F319 Bipolar disorder, unspecified: Secondary | ICD-10-CM

## 2013-01-09 NOTE — Progress Notes (Signed)
Patient ID: [name], female   DOB: [birth year], 78 y.o.   MRN: [id number]  Nursing Home Location:  [facility]   Place of Service: SNF (31)   Nursing Home Location:  [facility]   Place of Service: SNF [phone])  Chief Complaint  Patient presents with  . Medical Managment of Chronic Issues    HPI:  Patient is a 78 year old female with a PMH of HTN, DM, CHF and psychosis is being seen today for routine follow up. Pt is without complaints and staff without concerns. Reassessment of ongoing issues:  PE: currently on xarelto 20 mg tolerating well without any episodes of bleeding or noted blood loss Heart failure: remains on lasix, coreg, and lisinopril and asa Hyperlipidemia-  Taking Lipitor and fish oil  Hypertension- stable on coreg, lisinopril  Bipolar disorder- stable taking trilafon and symmetrel   Review of Systems:   DATA OBTAINED: from patient, nurse, and medical record GENERAL: Feels well no fevers, fatigue, appetite changes SKIN: No itching, rash or wounds EYES: No eye pain, redness, discharge MOUTH/THROAT: No mouth or tooth pain, No sore throat, No difficulty chewing or swallowing  RESPIRATORY: No cough, wheezing, SOB, conts to need O2- oxygen drops to 84% without oxygen CARDIAC: No chest pain, palpitations, lower extremity edema  GI: No abdominal pain, No N/V/D or constipation, No heartburn or reflux  GU: No dysuria, frequency or urgency, or incontinence  MUSCULOSKELETAL: No unrelieved bone/joint pain NEUROLOGIC: Awake, alert, appropriate to situation, No change in mental status. Moves all four, no focal deficits PSYCHIATRIC: No overt anxiety or sadness. Sleeps well. No behavior issue.    Medications: Patient's Medications  New Prescriptions   No medications on file  Previous Medications   AMANTADINE (SYMMETREL) 100 MG CAPSULE    Take 100 mg by mouth every morning.    ASPIRIN EC 81 MG TABLET    Take 81 mg by mouth every morning.    ATORVASTATIN (LIPITOR) 20 MG TABLET    Take 20 mg by mouth at bedtime.   CALCIUM-VITAMIN D (OSCAL 500/200 D-3) 500-200 MG-UNIT PER TABLET    Take 1 tablet by mouth 2 (two) times daily.   CARVEDILOL (COREG) 3.125 MG TABLET    Take 1 tablet (3.125 mg total) by mouth 2 (two) times daily with a meal.   FISH OIL-OMEGA-3 FATTY ACIDS 1000 MG CAPSULE    Take 1 g by mouth 2 (two) times daily.    FUROSEMIDE (LASIX) 20 MG TABLET    Take 1 tablet (20 mg total) by mouth every other day.   LISINOPRIL (PRINIVIL,ZESTRIL) 10 MG TABLET    Take 10 mg by mouth every morning.    MIRTAZAPINE (REMERON) 30 MG TABLET    Take 30 mg by mouth at bedtime.   PERPHENAZINE (TRILAFON) 8 MG TABLET    Take 16 mg by mouth at bedtime.    RIVAROXABAN (XARELTO) 20 MG TABS    Take 1 tablet (20 mg total) by mouth daily.  Modified Medications   No medications on file  Discontinued Medications   BESIFLOXACIN HCL (BESIVANCE) 0.6 % SUSP    Place 1 drop into the right eye 2 (two) times daily.    DIFLUPREDNATE (DUREZOL) 0.05 % EMUL    Place 1 drop into the right eye 2 (two) times daily.    NEPAFENAC (ILEVRO) 0.3 % SUSP    Place 1 drop into the right eye every morning.      Physical Exam:  Ceasar Mons  Vitals:   01/09/13 1439  BP: 142/80  Pulse: 70  Temp: 97.1 F (36.2 C)  Resp: 20  Weight: 184 lb (83.462 kg)  SpO2: 95%    GENERAL APPEARANCE: Alert, conversant. Appropriately groomed. No acute distress.  SKIN: No diaphoresis rash, or wounds HEAD: Normocephalic, atraumatic  EYES: Conjunctiva/lids clear. Pupils round, reactive. EOMs intact.  EARS: External exam WNL, canals clear. Hearing grossly normal.  NOSE: No deformity or discharge.  MOUTH/THROAT: Lips w/o lesions. Mouth and throat normal. Tongue moist, w/o lesion.  NECK: No thyroid tenderness, enlargement or nodule  RESPIRATORY: Breathing is even, unlabored. Lung sounds are clear   CARDIOVASCULAR: Heart RRR no murmurs, rubs or gallops. No peripheral edema.  GASTROINTESTINAL:  Abdomen is soft, non-tender, not distended w/ normal bowel sounds. No mass, ventral or inguinal hernia. No organomegally GENITOURINARY: Bladder non tender, not distended  MUSCULOSKELETAL: No abnormal joints or musculature NEUROLOGIC: Oriented X2. Cranial nerves 2-12 grossly intact. Moves all extremities no tremor. PSYCHIATRIC: Mood and affect appropriate to situation, no behavioral issues  Labs reviewed/Significant Diagnostic Results: Basic Metabolic Panel       Result: 12/24/2012 4:12 PM    ( Status: F )       C     Sodium  141        135-145  mEq/L  SLN       Potassium  3.7        3.5-5.3  mEq/L  SLN       Chloride  103        96-112  mEq/L  SLN       CO2  28        19-32  mEq/L  SLN       Glucose  82        70-99  mg/dL  SLN       BUN  13        6-23  mg/dL  SLN       Creatinine  0.73        0.50-1.35  mg/dL  SLN       Calcium  9.0        8.4-10.5  mg/dL  SLN       Assessment/Plan Bipolar disorder, unspecified- stable on current medications; no behaviors noted  Hypoxemia- conts to require o2   HTN (hypertension) Patient is stable; continue current regimen. Will monitor and make changes as necessary.  Pulmonary embolism- on xarelto- will follow up cbc at this time.   Diabetes: Glucophage was discontinued in hospital because of patient's confirmed diastolic heart failure. A1c in February 2014 was only 5.4. Will follow up A1c at this time.

## 2013-02-04 ENCOUNTER — Non-Acute Institutional Stay (SKILLED_NURSING_FACILITY): Payer: Medicare Other | Admitting: Nurse Practitioner

## 2013-02-04 DIAGNOSIS — I5032 Chronic diastolic (congestive) heart failure: Secondary | ICD-10-CM

## 2013-02-04 DIAGNOSIS — I10 Essential (primary) hypertension: Secondary | ICD-10-CM

## 2013-02-04 DIAGNOSIS — E119 Type 2 diabetes mellitus without complications: Secondary | ICD-10-CM

## 2013-02-04 DIAGNOSIS — F319 Bipolar disorder, unspecified: Secondary | ICD-10-CM

## 2013-02-04 DIAGNOSIS — I2699 Other pulmonary embolism without acute cor pulmonale: Secondary | ICD-10-CM

## 2013-02-04 NOTE — Progress Notes (Signed)
Patient ID: [name], female   DOB: May 08, 1935, 78 y.o.   MRN: [id number]  Nursing Home Location:  [facility]   Place of Service: SNF (31)  Chief Complaint  Patient presents with  . Medical Managment of Chronic Issues    HPI:  Patient is a 78 year old female with a PMH of HTN, DM, CHF and psychosis is being seen today for routine follow up. Pt is without complaints and staff without concerns. Pt conts to require o2 and is unable to be weaned off without desaturating.  Reassessment of ongoing issues:  PE: currently on xarelto 20 mg tolerating well without any episodes of bleeding or noted blood loss  Heart failure: remains on lasix, coreg, and lisinopril and asa  Hyperlipidemia- Taking Lipitor and fish oil  Hypertension- BP remain WNL on coreg, lisinopril  Bipolar disorder- stable on current medications, no behavioral issues taking trilafon and symmetrel    Review of Systems:    DATA OBTAINED: from patient, nurse, medical record GENERAL: Feels well no fevers, fatigue, appetite changes SKIN: No itching, rash or wounds EYES: No eye pain, redness, discharge EARS: No earache, tinnitus, change in hearing NOSE: No congestion, drainage or bleeding  MOUTH/THROAT: No mouth or tooth pain, No sore throat, No difficulty chewing or swallowing  RESPIRATORY: No cough, wheezing, SOB CARDIAC: No chest pain, palpitations, lower extremity edema  GI: No abdominal pain, No N/V/D or constipation, No heartburn or reflux  GU: No dysuria, frequency or urgency, or incontinence  MUSCULOSKELETAL: No unrelieved bone/joint pain NEUROLOGIC: Awake, alert, appropriate to situation, No change in mental status. Moves all four, no focal deficits PSYCHIATRIC: No overt anxiety or sadness. Sleeps well. No behavior issue.  AMBULATION:  Uses WC   Medications: Patient's Medications  New Prescriptions   No medications on file  Previous Medications   AMANTADINE (SYMMETREL) 100 MG CAPSULE    Take  100 mg by mouth every morning.    ASPIRIN EC 81 MG TABLET    Take 81 mg by mouth every morning.    ATORVASTATIN (LIPITOR) 20 MG TABLET    Take 20 mg by mouth at bedtime.   CALCIUM-VITAMIN D (OSCAL 500/200 D-3) 500-200 MG-UNIT PER TABLET    Take 1 tablet by mouth 2 (two) times daily.   CARVEDILOL (COREG) 3.125 MG TABLET    Take 1 tablet (3.125 mg total) by mouth 2 (two) times daily with a meal.   FISH OIL-OMEGA-3 FATTY ACIDS 1000 MG CAPSULE    Take 1 g by mouth 2 (two) times daily.    FUROSEMIDE (LASIX) 20 MG TABLET    Take 1 tablet (20 mg total) by mouth every other day.   LISINOPRIL (PRINIVIL,ZESTRIL) 10 MG TABLET    Take 10 mg by mouth every morning.    MIRTAZAPINE (REMERON) 30 MG TABLET    Take 30 mg by mouth at bedtime.   PERPHENAZINE (TRILAFON) 8 MG TABLET    Take 16 mg by mouth at bedtime.    RIVAROXABAN (XARELTO) 20 MG TABS    Take 1 tablet (20 mg total) by mouth daily.  Modified Medications   No medications on file  Discontinued Medications   No medications on file     Physical Exam:  Filed Vitals:   02/04/13 1613  BP: 109/71  Pulse: 60  Temp: 98.3 F (36.8 C)  Resp: 20  SpO2: 95%    GENERAL APPEARANCE: Alert, conversant. Appropriately groomed. No acute distress.  SKIN: No diaphoresis rash, or wounds  HEAD: Normocephalic, atraumatic  MOUTH/THROAT: Lips w/o lesions. Mouth and throat normal. Tongue moist, w/o lesion.  NECK: No thyroid tenderness, enlargement or nodule  RESPIRATORY: Breathing is even, unlabored. Lung sounds are clear   CARDIOVASCULAR: Heart RRR no murmurs, rubs or gallops. No peripheral edema.  ARTERIAL: radial pulse 2+ VENOUS: No varicosities. No venous stasis skin changes  GASTROINTESTINAL: Abdomen is soft, non-tender, not distended w/ normal bowel sounds. GENITOURINARY: Bladder non tender, not distended  MUSCULOSKELETAL: No abnormal joints or musculature NEUROLOGIC: Oriented X3. Cranial nerves 2-12 grossly intact. Moves all extremities no  tremor. PSYCHIATRIC: Mood and affect appropriate to situation, no behavioral issues  Labs reviewed/Significant Diagnostic Results: CBC with Diff       Result: 01/10/2013 9:09 PM    ( Status: F )            WBC  6.4        4.0-10.5  K/uL  SLN       RBC  4.39        3.87-5.11  MIL/uL  SLN       Hemoglobin  14.2        12.0-15.0  g/dL  SLN       Hematocrit  41.2        36.0-46.0  %  SLN       MCV  93.8        78.0-100.0  fL  SLN       MCH  32.3        26.0-34.0  pg  SLN       MCHC  34.5        30.0-36.0  g/dL  SLN       RDW  62.1        11.5-15.5  %  SLN       Platelet Count  198        150-400  K/uL  SLN       Granulocyte %  48        43-77  %  SLN       Absolute Gran  3.1        1.7-7.7  K/uL  SLN       Lymph %  37        12-46  %  SLN       Absolute Lymph  2.3        0.7-4.0  K/uL  SLN       Mono %  11        3-12  %  SLN       Absolute Mono  0.7        0.1-1.0  K/uL  SLN       Eos %  3        0-5  %  SLN       Absolute Eos  0.2        0.0-0.7  K/uL  SLN       Baso %  1        0-1  %  SLN       Absolute Baso  0.0        0.0-0.1  K/uL  SLN       Smear Review  Criteria for review not met   SLN      Hemoglobin A1C       Result: 01/11/2013 12:22 AM    ( Status: F )  Hemoglobin A1C  5.4        <5.7  %  SLN  C     Estimated Average Glucose  108   BMP with Estimated GFR       Result: 01/23/2013 3:35 PM    ( Status: F )            Sodium  141        135-145  mEq/L  SLN       Potassium  4.4        3.5-5.3  mEq/L  SLN       Chloride  103        96-112  mEq/L  SLN       CO2  29        19-32  mEq/L  SLN       Glucose  82        70-99  mg/dL  SLN       BUN  12        6-23  mg/dL  SLN       Creatinine  0.64        0.50-1.10  mg/dL  SLN       Calcium  9.0        8.4-10.5  mg/dL  SLN       Est GFR, African American  >89         mL/min  SLN       Est GFR, NonAfrican American  88      Assessment/Plan   1. HTN (hypertension) Stable on current medication  2. Diabetes pts AIC at  goal; does not need medications. Will cont to monitor  3. Bipolar disorder, unspecified Stable no behaviors noted; no depression or anxiety per pt  4. Chronic diastolic heart failure Stable on current medications  5. Pulmonary embolism Stable; on xarelto; hgb remains stable

## 2013-03-05 ENCOUNTER — Non-Acute Institutional Stay (SKILLED_NURSING_FACILITY): Payer: Medicare Other | Admitting: Nurse Practitioner

## 2013-03-05 DIAGNOSIS — I10 Essential (primary) hypertension: Secondary | ICD-10-CM

## 2013-03-05 DIAGNOSIS — F319 Bipolar disorder, unspecified: Secondary | ICD-10-CM

## 2013-03-05 DIAGNOSIS — I5032 Chronic diastolic (congestive) heart failure: Secondary | ICD-10-CM

## 2013-03-07 ENCOUNTER — Encounter: Payer: Self-pay | Admitting: Nurse Practitioner

## 2013-03-07 NOTE — Progress Notes (Signed)
Patient ID: [name], female   DOB: 1934/06/24, 78 y.o.   MRN: [id number]  Nursing Home Location:  [facility]   Place of Service: SNF (31)  Chief Complaint  Patient presents with  . Medical Managment of Chronic Issues    HPI:  Patient is a 78 year old female with a PMH of HTN, DM, CHF and psychosis is being seen today for routine follow up.  Pt conts to require o2 and is unable to be weaned off without desaturating. No reports of shortness of breath or chest pains. Pt is without complaints reports mood has been okay and staff without concerns.Reassessment of ongoing issues:  PE: currently on xarelto 20 mg tolerating well without any episodes of bleeding or noted blood loss  Heart failure: remains on lasix, coreg, and lisinopril and asa  Hyperlipidemia- Taking Lipitor and fish oil  Hypertension- BP remain WNL on coreg, lisinopril  Bipolar disorder- stable on current medications, no behavioral issues taking trilafon and symmetrel    Review of Systems:   DATA OBTAINED: from patient, nurse, medical record GENERAL: no fevers, fatigue, appetite changes SKIN: No itching, rash or wounds NOSE: No bleeding  MOUTH/THROAT: No mouth or tooth pain, No sore throat, No difficulty chewing or swallowing  RESPIRATORY: No cough, wheezing, SOB CARDIAC: No chest pain, palpitations, lower extremity edema  GI: No abdominal pain, No N/V/D or constipation, No heartburn or reflux  GU: No dysuria, frequency or urgency  MUSCULOSKELETAL: No unrelieved bone/joint pain NEUROLOGIC: Awake, alert, no change in mental status. Moves all four, no focal deficits PSYCHIATRIC: No overt anxiety or sadness. Sleeps well. No behavior issue.     Medications: Patient's Medications  New Prescriptions   No medications on file  Previous Medications   AMANTADINE (SYMMETREL) 100 MG CAPSULE    Take 100 mg by mouth every morning.    ASPIRIN EC 81 MG TABLET    Take 81 mg by mouth every morning.    ATORVASTATIN (LIPITOR) 20 MG TABLET    Take 20 mg by mouth at bedtime.   CALCIUM-VITAMIN D (OSCAL 500/200 D-3) 500-200 MG-UNIT PER TABLET    Take 1 tablet by mouth 2 (two) times daily.   CARVEDILOL (COREG) 3.125 MG TABLET    Take 1 tablet (3.125 mg total) by mouth 2 (two) times daily with a meal.   FISH OIL-OMEGA-3 FATTY ACIDS 1000 MG CAPSULE    Take 1 g by mouth 2 (two) times daily.    FUROSEMIDE (LASIX) 20 MG TABLET    Take 1 tablet (20 mg total) by mouth every other day.   LISINOPRIL (PRINIVIL,ZESTRIL) 10 MG TABLET    Take 10 mg by mouth every morning.    MIRTAZAPINE (REMERON) 30 MG TABLET    Take 30 mg by mouth at bedtime.   PERPHENAZINE (TRILAFON) 8 MG TABLET    Take 16 mg by mouth at bedtime.    RIVAROXABAN (XARELTO) 20 MG TABS    Take 1 tablet (20 mg total) by mouth daily.  Modified Medications   No medications on file  Discontinued Medications   No medications on file     Physical Exam:  Filed Vitals:   03/05/13 1000  BP: 142/82  Pulse: 85  Temp: 98.2 F (36.8 C)  Resp: 18  SpO2: 98%    GENERAL APPEARANCE: Alert, conversant. Appropriately groomed. No acute distress.  SKIN: No diaphoresis rash, or wounds HEAD: Normocephalic, atraumatic  EYES: Conjunctiva/lids clear. Pupils round, reactive. EOMs intact.  EARS: External  exam WNL, canals clear. Hearing grossly normal.  NOSE: No deformity or discharge.  MOUTH/THROAT: Lips w/o lesions. Mouth and throat normal. Tongue moist, w/o lesion.  NECK: No thyroid tenderness, enlargement or nodule  RESPIRATORY: Breathing is even, unlabored. Lung sounds are clear   CARDIOVASCULAR: Heart RRR no murmurs, rubs or gallops. No peripheral edema.  ARTERIAL: radial pulse 2+  GASTROINTESTINAL: Abdomen is soft, non-tender, not distended w/ normal bowel sounds. GENITOURINARY: Bladder non tender, not distended  MUSCULOSKELETAL: No abnormal joints or musculature NEUROLOGIC: Oriented x 2 Cranial nerves 2-12 grossly intact. Moves all extremities no  tremor. PSYCHIATRIC: Mood and affect appropriate to situation, no behavioral issues  Labs reviewed/Significant Diagnostic Results:   CBC with Diff  Result: 01/10/2013 9:09 PM ( Status: F )  WBC 6.4 4.0-10.5 K/uL SLN  RBC 4.39 3.87-5.11 MIL/uL SLN  Hemoglobin 14.2 12.0-15.0 g/dL SLN  Hematocrit 82.9 56.2-13.0 % SLN  MCV 93.8 78.0-100.0 fL SLN  MCH 32.3 26.0-34.0 pg SLN  MCHC 34.5 30.0-36.0 g/dL SLN  RDW 86.5 78.4-69.6 % SLN  Platelet Count 198 150-400 K/uL SLN  Granulocyte % 48 43-77 % SLN  Absolute Gran 3.1 1.7-7.7 K/uL SLN  Lymph % 37 12-46 % SLN  Absolute Lymph 2.3 0.7-4.0 K/uL SLN  Mono % 11 3-12 % SLN  Absolute Mono 0.7 0.1-1.0 K/uL SLN  Eos % 3 0-5 % SLN  Absolute Eos 0.2 0.0-0.7 K/uL SLN  Baso % 1 0-1 % SLN  Absolute Baso 0.0 0.0-0.1 K/uL SLN  Smear Review Criteria for review not met SLN  Hemoglobin A1C  Result: 01/11/2013 12:22 AM ( Status: F )  Hemoglobin A1C 5.4 <5.7 % SLN C  Estimated Average Glucose 108  BMP with Estimated GFR  Result: 01/23/2013 3:35 PM ( Status: F )  Sodium 141 135-145 mEq/L SLN  Potassium 4.4 3.5-5.3 mEq/L SLN  Chloride 103 96-112 mEq/L SLN  CO2 29 19-32 mEq/L SLN  Glucose 82 70-99 mg/dL SLN  BUN 12 2-95 mg/dL SLN  Creatinine 2.84 1.32-4.40 mg/dL SLN  Calcium 9.0 1.0-27.2 mg/dL SLN  Est GFR, African American >89 mL/min SLN  Est GFR, NonAfrican American 88   BMP with Estimated GFR       Result: 01/23/2013 3:35 PM    ( Status: F )            Sodium  141        135-145  mEq/L  SLN       Potassium  4.4        3.5-5.3  mEq/L  SLN       Chloride  103        96-112  mEq/L  SLN       CO2  29        19-32  mEq/L  SLN       Glucose  82        70-99  mg/dL  SLN       BUN  12        6-23  mg/dL  SLN       Creatinine  0.64        0.50-1.10  mg/dL  SLN       Calcium  9.0        8.4-10.5  mg/dL  SLN       Est GFR, African American  >89         mL/min  SLN       Est GFR, NonAfrican American  88  Assessment/Plan  HTN (hypertension)  Blood  pressures stable will cont current medications   Bipolar disorder, unspecified  Mood stable;  no behaviors noted; will cont current medications   Chronic diastolic heart failure  Stable on current medications; requiring O2

## 2013-03-25 ENCOUNTER — Encounter: Payer: Self-pay | Admitting: Nurse Practitioner

## 2013-03-25 ENCOUNTER — Non-Acute Institutional Stay (SKILLED_NURSING_FACILITY): Payer: Medicare Other | Admitting: Nurse Practitioner

## 2013-03-25 DIAGNOSIS — I10 Essential (primary) hypertension: Secondary | ICD-10-CM

## 2013-03-25 DIAGNOSIS — F319 Bipolar disorder, unspecified: Secondary | ICD-10-CM

## 2013-03-25 DIAGNOSIS — E785 Hyperlipidemia, unspecified: Secondary | ICD-10-CM

## 2013-03-25 DIAGNOSIS — I5032 Chronic diastolic (congestive) heart failure: Secondary | ICD-10-CM

## 2013-03-25 NOTE — Progress Notes (Signed)
Patient ID: [name], female   DOB: Jan 07, 1935, 78 y.o.   MRN: [id number]   PCP: Leanor Rubenstein, MD   No Known Allergies  Chief Complaint  Patient presents with  . Medical Managment of Chronic Issues    HPI:  Patient is a 78 year old female with a PMH of HTN, DM, CHF and psychosis is being seen today for routine follow up. No reports of shortness of breath or chest pains. Pt is without complaints and staff without concerns. Pt has been stable within the last month  Reassessment of ongoing issues:  PE: currently on xarelto 20 mg tolerating well without any episodes of bleeding or noted blood loss  Heart failure: remains on lasix, coreg, and lisinopril and asa and requiring 02 Hyperlipidemia- Taking Lipitor and fish oil  Hypertension- BP remain WNL on coreg, lisinopril  Bipolar disorder- stable on current medications, no behavioral issues taking trilafon and symmetrel    Review of Systems:  Review of Systems  Constitutional: Negative for fever and chills.  HENT: Negative.   Eyes: Negative for blurred vision, pain, discharge and redness.  Respiratory: Negative for cough and shortness of breath.   Cardiovascular: Negative for chest pain.  Gastrointestinal: Negative for abdominal pain, diarrhea and constipation.  Genitourinary: Negative for dysuria, urgency and frequency.  Musculoskeletal: Negative for myalgias.  Skin: Negative.   Neurological: Negative for dizziness and weakness.  Psychiatric/Behavioral: Negative for depression. The patient is not nervous/anxious and does not have insomnia.      Past Medical History  Diagnosis Date  . Hypertension   . Diabetes mellitus   . Schizophrenia   . Hyperlipemia    Past Surgical History  Procedure Laterality Date  . Cholecystectomy    . Abdominal hysterectomy     Social History:   reports that she has never smoked. She has never used smokeless tobacco. She reports that she does not drink alcohol or use illicit drugs.  Family  History  Problem Relation Age of Onset  . Heart disease Mother   . Kidney disease Father     Medications: Patient's Medications  New Prescriptions   No medications on file  Previous Medications   AMANTADINE (SYMMETREL) 100 MG CAPSULE    Take 100 mg by mouth every morning.    ASPIRIN EC 81 MG TABLET    Take 81 mg by mouth every morning.    ATORVASTATIN (LIPITOR) 20 MG TABLET    Take 20 mg by mouth at bedtime.   CALCIUM-VITAMIN D (OSCAL 500/200 D-3) 500-200 MG-UNIT PER TABLET    Take 1 tablet by mouth 2 (two) times daily.   CARVEDILOL (COREG) 3.125 MG TABLET    Take 1 tablet (3.125 mg total) by mouth 2 (two) times daily with a meal.   FISH OIL-OMEGA-3 FATTY ACIDS 1000 MG CAPSULE    Take 1 g by mouth 2 (two) times daily.    FUROSEMIDE (LASIX) 20 MG TABLET    Take 1 tablet (20 mg total) by mouth every other day.   LISINOPRIL (PRINIVIL,ZESTRIL) 10 MG TABLET    Take 10 mg by mouth every morning.    MIRTAZAPINE (REMERON) 30 MG TABLET    Take 30 mg by mouth at bedtime.   PERPHENAZINE (TRILAFON) 8 MG TABLET    Take 16 mg by mouth at bedtime.    RIVAROXABAN (XARELTO) 20 MG TABS    Take 1 tablet (20 mg total) by mouth daily.  Modified Medications   No medications on file  Discontinued Medications  No medications on file     Physical Exam: Physical Exam  Constitutional: She is oriented to person, place, and time and well-developed, well-nourished, and in no distress. No distress.  HENT:  Head: Normocephalic and atraumatic.  Nose: Nose normal.  Mouth/Throat: Oropharynx is clear and moist. No oropharyngeal exudate.  Eyes: Conjunctivae and EOM are normal. Pupils are equal, round, and reactive to light.  Neck: Normal range of motion. Neck supple. No thyromegaly present.  Cardiovascular: Normal rate, regular rhythm and normal heart sounds.   Pulmonary/Chest: Effort normal and breath sounds normal. No respiratory distress.  Abdominal: Soft. Bowel sounds are normal. She exhibits no distension.   Musculoskeletal: Normal range of motion. She exhibits no edema.  Lymphadenopathy:    She has no cervical adenopathy.  Neurological: She is alert and oriented to person, place, and time.  Skin: Skin is warm and dry. She is not diaphoretic. No erythema.    Filed Vitals:   03/25/13 1100  BP: 129/64  Pulse: 64  Temp: 97 F (36.1 C)  Resp: 18      Labs reviewed: Basic Metabolic Panel:  Recent Labs  40/98/11 2300 10/03/12 0323 10/08/12 0523  NA 136 133* 139  K 3.8 3.4* 3.9  CL 101 94* 101  CO2 26 32 30  GLUCOSE 107* 108* 100*  BUN 9 9 11   CREATININE 0.70 0.75 0.66  CALCIUM 9.4 9.8 9.0  MG  --  1.6  --   PHOS  --  3.8  --    Liver Function Tests:  Recent Labs  08/04/12 1916 08/05/12 0411 10/03/12 0323  AST 23 24 16   ALT 15 15 13   ALKPHOS 57 52 67  BILITOT 0.2* 0.2* 0.5  PROT 6.2 5.5* 5.9*  ALBUMIN 2.9* 2.6* 3.1*   No results found for this basename: LIPASE, AMYLASE,  in the last 8760 hours No results found for this basename: AMMONIA,  in the last 8760 hours CBC:  Recent Labs  05/23/12 0412 08/04/12 1916  09/23/12 2124 10/02/12 2300 10/03/12 0323  WBC 7.2 9.8  < > 6.9 8.0 8.9  NEUTROABS 4.4 7.8*  --   --  5.3  --   HGB 14.6 14.2  < > 15.1* 15.7* 14.7  HCT 44.4 42.5  < > 45.5 46.5* 44.7  MCV 98.0 98.4  < > 97.4 95.5 96.5  PLT 185 160  < > 158 163 171  < > = values in this interval not displayed. Cardiac Enzymes:  Recent Labs  10/03/12 0323 10/03/12 0800 10/03/12 1431  TROPONINI <0.30 <0.30 <0.30   BNP: No components found with this basename: POCBNP,  CBG:  Recent Labs  10/07/12 1728 10/07/12 2220 10/08/12 0800  GLUCAP 109* 122* 105*    Result: 01/10/2013 9:09 PM ( Status: F )  WBC 6.4 4.0-10.5 K/uL SLN  RBC 4.39 3.87-5.11 MIL/uL SLN  Hemoglobin 14.2 12.0-15.0 g/dL SLN  Hematocrit 91.4 78.2-95.6 % SLN  MCV 93.8 78.0-100.0 fL SLN  MCH 32.3 26.0-34.0 pg SLN  MCHC 34.5 30.0-36.0 g/dL SLN  RDW 21.3 08.6-57.8 % SLN  Platelet Count  198 150-400 K/uL SLN  Granulocyte % 48 43-77 % SLN  Absolute Gran 3.1 1.7-7.7 K/uL SLN  Lymph % 37 12-46 % SLN  Absolute Lymph 2.3 0.7-4.0 K/uL SLN  Mono % 11 3-12 % SLN  Absolute Mono 0.7 0.1-1.0 K/uL SLN  Eos % 3 0-5 % SLN  Absolute Eos 0.2 0.0-0.7 K/uL SLN  Baso % 1 0-1 % SLN  Absolute Baso  0.0 0.0-0.1 K/uL SLN  Smear Review Criteria for review not met SLN  Hemoglobin A1C  Result: 01/11/2013 12:22 AM ( Status: F )  Hemoglobin A1C 5.4 <5.7 % SLN C  Estimated Average Glucose 108  BMP with Estimated GFR  Result: 01/23/2013 3:35 PM ( Status: F )  Sodium 141 135-145 mEq/L SLN  Potassium 4.4 3.5-5.3 mEq/L SLN  Chloride 103 96-112 mEq/L SLN  CO2 29 19-32 mEq/L SLN  Glucose 82 70-99 mg/dL SLN  BUN 12 2-44 mg/dL SLN  Creatinine 0.10 2.72-5.36 mg/dL SLN  Calcium 9.0 6.4-40.3 mg/dL SLN  Est GFR, African American >89 mL/min SLN  Est GFR, NonAfrican American 88  BMP with Estimated GFR  Result: 01/23/2013 3:35 PM ( Status: F )  Sodium 141 135-145 mEq/L SLN  Potassium 4.4 3.5-5.3 mEq/L SLN  Chloride 103 96-112 mEq/L SLN  CO2 29 19-32 mEq/L SLN  Glucose 82 70-99 mg/dL SLN  BUN 12 4-74 mg/dL SLN  Creatinine 2.59 5.63-8.75 mg/dL SLN  Calcium 9.0 6.4-33.2 mg/dL SLN  Est GFR, African American >89 mL/min SLN  Est GFR, NonAfrican American 88   Assessment/Plan 1. HTN (hypertension) Patient is stable; continue current regimen. Will monitor and make changes as necessary. Will get EKG at this time  2. Chronic diastolic heart failure Stable on current medications; will follow up bmp next month   3. Bipolar disorder, unspecified Mood stable  4. Other and unspecified hyperlipidemia Tolerating medications without side effect;  will follow up lipids with next month blood draws

## 2013-04-16 ENCOUNTER — Encounter: Payer: Self-pay | Admitting: Internal Medicine

## 2013-04-16 ENCOUNTER — Non-Acute Institutional Stay (SKILLED_NURSING_FACILITY): Payer: Medicare Other | Admitting: Internal Medicine

## 2013-04-16 DIAGNOSIS — F319 Bipolar disorder, unspecified: Secondary | ICD-10-CM

## 2013-04-16 DIAGNOSIS — I5032 Chronic diastolic (congestive) heart failure: Secondary | ICD-10-CM

## 2013-04-16 DIAGNOSIS — E119 Type 2 diabetes mellitus without complications: Secondary | ICD-10-CM

## 2013-04-16 DIAGNOSIS — I10 Essential (primary) hypertension: Secondary | ICD-10-CM

## 2013-04-18 NOTE — Progress Notes (Signed)
MRN: [id number] Name: [name]  Sex: female Age: 78 y.o. DOB: 11/10/1934  PSC #: [facility] Facility/Room: Level Of Care: SNF Provider: Merrilee Seashore D Emergency Contacts: Extended Emergency Contact Information Primary Emergency Contact: Hassell,Daniel Address: [address]          [place] [address] [place] Home Phone: [phone] Mobile Phone: [phone] Relation: Son Secondary Emergency Contact: Hassell,Dayna Address: [address]           [place], [place] [address] [place] Home Phone: [phone] Relation: Daughter  Code Status: FULL  Allergies: Review of patient's allergies indicates no known allergies.  Chief Complaint  Patient presents with  . Discharge Note    HPI: Patient is 78 y.o. female who is being discharged to assisted living.  Past Medical History  Diagnosis Date  . Hypertension   . Diabetes mellitus   . Schizophrenia   . Hyperlipemia     Past Surgical History  Procedure Laterality Date  . Cholecystectomy    . Abdominal hysterectomy        Medication List       This list is accurate as of: 04/16/13 11:59 PM.  Always use your most recent med list.               amantadine 100 MG capsule  Commonly known as:  SYMMETREL  Take 100 mg by mouth every morning.     atorvastatin 20 MG tablet  Commonly known as:  LIPITOR  Take 20 mg by mouth at bedtime.     carvedilol 3.125 MG tablet  Commonly known as:  COREG  Take 1 tablet (3.125 mg total) by mouth 2 (two) times daily with a meal.     fish oil-omega-3 fatty acids 1000 MG capsule  Take 1 g by mouth 2 (two) times daily.     furosemide 20 MG tablet  Commonly known as:  LASIX  Take 1 tablet (20 mg total) by mouth every other day.     lisinopril 10 MG tablet  Commonly known as:  PRINIVIL,ZESTRIL  Take 10 mg by mouth every morning.     mirtazapine 30 MG tablet  Commonly known as:  REMERON  Take 30 mg by mouth at bedtime.     OSCAL  500/200 D-3 500-200 MG-UNIT per tablet  Generic drug:  calcium-vitamin D  Take 1 tablet by mouth 2 (two) times daily.     perphenazine 8 MG tablet  Commonly known as:  TRILAFON  Take 16 mg by mouth at bedtime.     Rivaroxaban 20 MG Tabs tablet  Commonly known as:  XARELTO  Take 1 tablet (20 mg total) by mouth daily.        No orders of the defined types were placed in this encounter.    Immunization History  Administered Date(s) Administered  . Influenza Split 08/06/2012  . Pneumococcal Polysaccharide 08/06/2012    History  Substance Use Topics  . Smoking status: Never Smoker   . Smokeless tobacco: Never Used  . Alcohol Use: No    Filed Vitals:   04/16/13 1428  BP: 156/84  Pulse: 58  Temp: 97.5 F (36.4 C)  Resp: 19    Physical Exam  GENERAL APPEARANCE: Alert, conversant. Appropriately groomed. No acute distress.  HEENT: Unremarkable. RESPIRATORY: Breathing is even, unlabored. Lung sounds are clear   CARDIOVASCULAR: Heart RRR no murmurs, rubs or gallops. No peripheral edema.  GASTROINTESTINAL: Abdomen is soft, non-tender, not distended w/ normal bowel sounds.  NEUROLOGIC:  Cranial nerves 2-12 grossly intact. Moves all extremities no tremor.  Patient Active Problem List   Diagnosis Date Noted  . Bipolar disorder, unspecified 11/15/2012  . Chronic diastolic heart failure 10/04/2012  . Urinary retention 10/04/2012  . Physical deconditioning 10/04/2012  . Pulmonary embolism 10/04/2012  . Hypoxemia 10/03/2012  . Hypoglycemia 08/05/2012  . HTN (hypertension) 08/05/2012  . Diabetes 08/05/2012  . Agitation 08/05/2012  . UTI (lower urinary tract infection) 08/05/2012    CBC    Component Value Date/Time   WBC 8.9 10/03/2012 0323   RBC 4.63 10/03/2012 0323   HGB 14.7 10/03/2012 0323   HCT 44.7 10/03/2012 0323   PLT 171 10/03/2012 0323   MCV 96.5 10/03/2012 0323   LYMPHSABS 1.6 10/02/2012 2300   MONOABS 0.8 10/02/2012 2300   EOSABS 0.3 10/02/2012 2300   BASOSABS  0.0 10/02/2012 2300    CMP     Component Value Date/Time   NA 139 10/08/2012 0523   K 3.9 10/08/2012 0523   CL 101 10/08/2012 0523   CO2 30 10/08/2012 0523   GLUCOSE 100* 10/08/2012 0523   BUN 11 10/08/2012 0523   CREATININE 0.66 10/08/2012 0523   CALCIUM 9.0 10/08/2012 0523   PROT 5.9* 10/03/2012 0323   ALBUMIN 3.1* 10/03/2012 0323   AST 16 10/03/2012 0323   ALT 13 10/03/2012 0323   ALKPHOS 67 10/03/2012 0323   BILITOT 0.5 10/03/2012 0323   GFRNONAA 84* 10/08/2012 0523   GFRAA >90 10/08/2012 0523    Assessment and Plan  PT HAS BEEN STABLE ON HER MEDICATIONS AND IS READY TO GO TO AN ASSISTED LIVING FACILITY.  Margit Hanks, MD

## 2013-10-21 ENCOUNTER — Emergency Department (HOSPITAL_COMMUNITY)
Admission: EM | Admit: 2013-10-21 | Discharge: 2013-10-21 | Disposition: A | Discharge status: Home / Self Care | Payer: Medicare Other | Attending: Emergency Medicine | Admitting: Emergency Medicine

## 2013-10-21 ENCOUNTER — Encounter (HOSPITAL_COMMUNITY): Payer: Self-pay | Admitting: Emergency Medicine

## 2013-10-21 DIAGNOSIS — Z7901 Long term (current) use of anticoagulants: Secondary | ICD-10-CM | POA: Insufficient documentation

## 2013-10-21 DIAGNOSIS — R040 Epistaxis: Secondary | ICD-10-CM | POA: Insufficient documentation

## 2013-10-21 DIAGNOSIS — E119 Type 2 diabetes mellitus without complications: Secondary | ICD-10-CM | POA: Insufficient documentation

## 2013-10-21 DIAGNOSIS — E785 Hyperlipidemia, unspecified: Secondary | ICD-10-CM | POA: Insufficient documentation

## 2013-10-21 DIAGNOSIS — I10 Essential (primary) hypertension: Secondary | ICD-10-CM | POA: Insufficient documentation

## 2013-10-21 DIAGNOSIS — I5030 Unspecified diastolic (congestive) heart failure: Secondary | ICD-10-CM | POA: Insufficient documentation

## 2013-10-21 DIAGNOSIS — Z86711 Personal history of pulmonary embolism: Secondary | ICD-10-CM | POA: Insufficient documentation

## 2013-10-21 DIAGNOSIS — Z79899 Other long term (current) drug therapy: Secondary | ICD-10-CM | POA: Insufficient documentation

## 2013-10-21 HISTORY — DX: Retention of urine, unspecified: R33.9

## 2013-10-21 HISTORY — DX: Other pulmonary embolism without acute cor pulmonale: I26.99

## 2013-10-21 HISTORY — DX: Unspecified diastolic (congestive) heart failure: I50.30

## 2013-10-21 LAB — COMPREHENSIVE METABOLIC PANEL
ALBUMIN: 3.3 g/dL — AB (ref 3.5–5.2)
ALK PHOS: 71 U/L (ref 39–117)
ALT: 15 U/L (ref 0–35)
AST: 18 U/L (ref 0–37)
BUN: 16 mg/dL (ref 6–23)
CO2: 29 mEq/L (ref 19–32)
Calcium: 9.2 mg/dL (ref 8.4–10.5)
Chloride: 99 mEq/L (ref 96–112)
Creatinine, Ser: 0.65 mg/dL (ref 0.50–1.10)
GFR calc Af Amer: >90 mL/min (ref >90)
GFR calc non Af Amer: 84 mL/min — ABNORMAL LOW (ref >90)
Glucose, Bld: 161 mg/dL — ABNORMAL HIGH (ref 70–99)
POTASSIUM: 4.1 meq/L (ref 3.7–5.3)
SODIUM: 140 meq/L (ref 137–147)
Total Bilirubin: 0.4 mg/dL (ref 0.3–1.2)
Total Protein: 6.7 g/dL (ref 6.0–8.3)

## 2013-10-21 LAB — CBC
HCT: 45.3 % (ref 36.0–46.0)
Hemoglobin: 14.9 g/dL (ref 12.0–15.0)
MCH: 31.7 pg (ref 26.0–34.0)
MCHC: 32.9 g/dL (ref 30.0–36.0)
MCV: 96.4 fL (ref 78.0–100.0)
Platelets: 187 10*3/uL (ref 150–400)
RBC: 4.7 MIL/uL (ref 3.87–5.11)
RDW: 12.9 % (ref 11.5–15.5)
WBC: 7.9 10*3/uL (ref 4.0–10.5)

## 2013-10-21 LAB — PROTIME-INR
INR: 0.95 (ref 0.00–1.49)
PROTHROMBIN TIME: 12.5 s (ref 11.6–15.2)

## 2013-10-21 MED ORDER — OXYMETAZOLINE HCL 0.05 % NA SOLN: 2.0000 | NASAL | Status: AC | PRN

## 2013-10-21 NOTE — ED Provider Notes (Signed)
CSN: [id number]     Arrival date & time 10/21/13  0717 History   First MD Initiated Contact with Patient 10/21/13 [phone]     Chief Complaint  Patient presents with  . Epistaxis     (Consider location/radiation/quality/duration/timing/severity/associated sxs/prior Treatment) Patient is a 79 y.o. female presenting with nosebleeds.  Epistaxis Associated symptoms: no dizziness and no fever     [name] is a 79 y.o. female PMH PE on Xarelto (diagnosed 4/14), HTN, DM2, chronic dCHF, psychosis who presents with epistaxis.  She reports a nosebleed starting last night after she picked her nose. Staff at the ALF were able to stop the bleeding. She says this happens occasionally, usually in the setting of picking her nose. This morning at breakfast the bleeding started again and was brisk. She says it was "gushing." She cannot estimate how much blood was lost. She cannot tell me why she takes Xarelto and cannot recall if she has had more than one PE (to [facility]   Past Medical History  Diagnosis Date  . Hypertension   . Diabetes mellitus   . Schizophrenia   . Hyperlipemia   . PE (pulmonary embolism)   . Pulmonary infarction   . Diastolic heart failure   . Urinary retention    Past Surgical History  Procedure Laterality Date  . Cholecystectomy    . Abdominal hysterectomy     Family History  Problem Relation Age of Onset  . Heart disease Mother   . Kidney disease Father    History  Substance Use Topics  . Smoking status: Never Smoker   . Smokeless tobacco: Never Used  . Alcohol Use: No   OB History   Grav Para Term Preterm Abortions TAB SAB Ect Mult Living                 Review of Systems  Constitutional: Negative for fever and chills.  HENT: Positive for nosebleeds.   Respiratory: Negative for shortness of breath.   Cardiovascular: Negative for chest pain.  Gastrointestinal: Negative for abdominal pain.  Neurological: Negative for  dizziness and weakness.    Allergies  Review of patient's allergies indicates no known allergies.  Home Medications   Prior to Admission medications   Medication Sig Start Date End Date Taking? Authorizing Provider  amantadine (SYMMETREL) 100 MG capsule Take 100 mg by mouth every morning.     Historical Provider, MD  atorvastatin (LIPITOR) 20 MG tablet Take 20 mg by mouth at bedtime.    Historical Provider, MD  calcium-vitamin D (OSCAL 500/200 D-3) 500-200 MG-UNIT per tablet Take 1 tablet by mouth 2 (two) times daily.    Historical Provider, MD  carvedilol (COREG) 3.125 MG tablet Take 1 tablet (3.125 mg total) by mouth 2 (two) times daily with a meal. 10/08/12   Catarina Hartshornavid Tat, MD  fish oil-omega-3 fatty acids 1000 MG capsule Take 1 g by mouth 2 (two) times daily.     Historical Provider, MD  furosemide (LASIX) 20 MG tablet Take 1 tablet (20 mg total) by mouth every other day. 10/06/12   Hollice EspySendil K Krishnan, MD  lisinopril (PRINIVIL,ZESTRIL) 10 MG tablet Take 10 mg by mouth every morning.     Historical Provider, MD  mirtazapine (REMERON) 30 MG tablet Take 30 mg by mouth at bedtime.    Historical Provider, MD  perphenazine (TRILAFON) 8 MG tablet Take 16 mg by mouth at bedtime.     Historical Provider, MD  Rivaroxaban (XARELTO) 20 MG TABS Take 1 tablet (20 mg total) by mouth daily. 10/27/12   Hollice EspySendil K Krishnan, MD   BP 149/96  Pulse 71  Temp(Src) 98.3 F (36.8 C) (Oral)  SpO2 93% Physical Exam  Constitutional: She appears well-developed and well-nourished.  Pleasant  HENT:  Nose: Epistaxis is observed.  Tampon in place left nare. When removed, there is a slow drip of dark venous blood. Replaced with RhinoRocket.  Eyes: Conjunctivae and EOM are normal. Pupils are equal, round, and reactive to light.  Neck: Normal range of motion. Neck supple.  Cardiovascular: Normal rate and regular rhythm.  Exam reveals no gallop and no friction rub.   No murmur heard. Pulmonary/Chest: Effort normal and  breath sounds normal. No respiratory distress. She has no wheezes. She has no rales. She exhibits no tenderness.    ED Course  Procedures (including critical care time) Labs Review Labs Reviewed  COMPREHENSIVE METABOLIC PANEL - Abnormal; Notable for the following:    Glucose, Bld 161 (*)    Albumin 3.3 (*)    GFR calc non Af Amer 84 (*)    All other components within normal limits  CBC  PROTIME-INR    Imaging Review No results found.   EKG Interpretation None      MDM   Final diagnoses:  Epistaxis    79 y.o. F on Xarelto with epistaxis. Bleeding has stopped after application of a RhinoRocket. Checking CBC, CMP, PT/INR. Treating hypoxia with supplemental oxygen. This may be a chronic issue. Per notes we have available from the nursing home, in the months after her PE she required O2 and would desaturate without it. Patient denies SOB and lungs are clear.   9:00AM Blood noted to be dripping from RhinoRocket. Nurse went to replace it but after removal, bleeding was actually noted to have stopped. Will leave it out and continue to monitor, replace as needed. CBC and CMP are wnl. Hgb is 14.9. PT/INR wnl.  9:30AM No active bleeding. Patient stable for discharge. She will need ENT follow up. Also will need PCP follow up to discuss appropriateness of continuing Xarelto given PE was diagnosed >1 year ago.   Vivi BarrackSarah Indira Sorenson, MD 10/21/13 [phone]

## 2013-10-21 NOTE — ED Notes (Signed)
MD at bedside. 

## 2013-10-21 NOTE — ED Provider Notes (Signed)
I saw and evaluated the patient, reviewed the resident's note and I agree with the findings and plan.   EKG Interpretation None     ROS reviewed and all otherwise negative except for mentioned in HPI  Pt seen and examined.  Rhino rocket placed and initially had controlled bleeding, then began to ooze around this- rhino rocket removed without further bleeding.  Discharged with strict return precautions.  Pt agreeable with plan.   Ethelda ChickMartha K Linker, MD 10/21/13 [phone]

## 2013-10-21 NOTE — ED Notes (Signed)
Patient's nose was beginning to bleed from nasal absorbent packing again, per MD verbal, was told to remove the old packing and reinsert a new packing with Bacitracin. When old packing was removed there was no bleeding so no new packing was inserted. MD aware.

## 2013-10-21 NOTE — ED Notes (Signed)
Bed: [id number] Expected date:  Expected time:  Means of arrival:  Comments: EMS 78yo F, nosebleed, off and on all night; unable to control bleeding

## 2013-10-21 NOTE — ED Notes (Addendum)
[facility] EMS[facility] and has had a history problems with her sinuses being dry. Pt's nose has been bleeding from her left nostril all night but this time the staff at [facility] was unable to stop the bleeding. EMS gave 2 squirts of Afrin and placed tampon at 0645. Pt takes xarelto. EMS applied ice and a bandage but the bleeding remained uncontrolled. This is a chronic problem but usually the staff at [facility] is able to control the bleeding.

## 2013-10-21 NOTE — Discharge Instructions (Signed)
Thank you for your visit. - Please follow up with your PCP this week for routine follow up. I recommend you talk to him about the Xarelto and whether it's necessary to continue taking it. In certain cases, you can stop taking it after a year. In other cases, you need to be on blood thinners for life. It depends on your medical history. - Please follow up with an ear, nose, throat doctor as soon as possible. Number has been provided above. - Please do not pick your nose or stick any objects in your nose, this will help prevent recurrence of the nose bleed. - I have prescribed Afrin spray to be used as needed for nose bleed. - If you develop recurrent nose bleeds that are unable to be stopped, shortness of breath, chest pain, lightheadedness, please call your doctor or return to the ED.

## 2013-10-21 NOTE — ED Notes (Signed)
Attempted to call report to nursing facility x 3. Put on hold, facility hung up on RN x 2, RN remained on hold for > 15 minutes x 1.

## 2013-10-27 ENCOUNTER — Emergency Department (HOSPITAL_COMMUNITY)
Admission: EM | Admit: 2013-10-27 | Discharge: 2013-10-27 | Disposition: A | Discharge status: Home / Self Care | Payer: Medicare Other | Attending: Emergency Medicine | Admitting: Emergency Medicine

## 2013-10-27 ENCOUNTER — Encounter (HOSPITAL_COMMUNITY): Payer: Self-pay | Admitting: Emergency Medicine

## 2013-10-27 DIAGNOSIS — Z86711 Personal history of pulmonary embolism: Secondary | ICD-10-CM | POA: Insufficient documentation

## 2013-10-27 DIAGNOSIS — F209 Schizophrenia, unspecified: Secondary | ICD-10-CM | POA: Insufficient documentation

## 2013-10-27 DIAGNOSIS — R040 Epistaxis: Secondary | ICD-10-CM | POA: Insufficient documentation

## 2013-10-27 DIAGNOSIS — Z7901 Long term (current) use of anticoagulants: Secondary | ICD-10-CM | POA: Insufficient documentation

## 2013-10-27 DIAGNOSIS — I5030 Unspecified diastolic (congestive) heart failure: Secondary | ICD-10-CM | POA: Insufficient documentation

## 2013-10-27 DIAGNOSIS — I10 Essential (primary) hypertension: Secondary | ICD-10-CM | POA: Insufficient documentation

## 2013-10-27 DIAGNOSIS — Z79899 Other long term (current) drug therapy: Secondary | ICD-10-CM | POA: Insufficient documentation

## 2013-10-27 DIAGNOSIS — E119 Type 2 diabetes mellitus without complications: Secondary | ICD-10-CM | POA: Insufficient documentation

## 2013-10-27 DIAGNOSIS — E785 Hyperlipidemia, unspecified: Secondary | ICD-10-CM | POA: Insufficient documentation

## 2013-10-27 LAB — CBC WITH DIFFERENTIAL/PLATELET
BASOS ABS: 0 10*3/uL (ref 0.0–0.1)
BASOS PCT: 1 % (ref 0–1)
EOS PCT: 2 % (ref 0–5)
Eosinophils Absolute: 0.1 10*3/uL (ref 0.0–0.7)
HCT: 43.8 % (ref 36.0–46.0)
Hemoglobin: 14.5 g/dL (ref 12.0–15.0)
LYMPHS ABS: 1.8 10*3/uL (ref 0.7–4.0)
LYMPHS PCT: 29 % (ref 12–46)
MCH: 31.8 pg (ref 26.0–34.0)
MCHC: 33.1 g/dL (ref 30.0–36.0)
MCV: 96.1 fL (ref 78.0–100.0)
MONO ABS: 0.6 10*3/uL (ref 0.1–1.0)
Monocytes Relative: 10 % (ref 3–12)
NEUTROS PCT: 59 % (ref 43–77)
Neutro Abs: 3.7 10*3/uL (ref 1.7–7.7)
Platelets: 190 10*3/uL (ref 150–400)
RBC: 4.56 MIL/uL (ref 3.87–5.11)
RDW: 13.1 % (ref 11.5–15.5)
WBC: 6.4 10*3/uL (ref 4.0–10.5)

## 2013-10-27 MED ORDER — OXYMETAZOLINE HCL 0.05 % NA SOLN
1.0000 | Freq: Once | NASAL | Status: AC
Start: 2013-10-27 — End: 2013-10-27
  Administered 2013-10-27: 1 via NASAL
  Filled 2013-10-27: qty 15

## 2013-10-27 NOTE — Discharge Instructions (Signed)

## 2013-10-27 NOTE — ED Provider Notes (Signed)
CSN: [id number]     Arrival date & time 10/27/13  0749 History   First MD Initiated Contact with Patient 10/27/13 0830     Chief Complaint  Patient presents with  . Epistaxis   HPI Patient presents to the emergency room with complaints of recurrent nose bleeding. Patient has history of prior nosebleeds. She was seen in the emergency room on may 5th for a nosebleed involving her left nostril. Patient states she had been doing well until this morning when her nose started bleeding again. She was brought to the emergency room for evaluation. During the transportation the bleeding spontaneously stopped. Patient admits she had been picking her nose the previous week when the bleeding started. However, she has been avoiding doing that and is not sure why her nose started bleeding this morning. She does take Xarelto. She denies any bleeding elsewhere. Past Medical History  Diagnosis Date  . Hypertension   . Diabetes mellitus   . Schizophrenia   . Hyperlipemia   . PE (pulmonary embolism)   . Pulmonary infarction   . Diastolic heart failure   . Urinary retention    Past Surgical History  Procedure Laterality Date  . Cholecystectomy    . Abdominal hysterectomy     Family History  Problem Relation Age of Onset  . Heart disease Mother   . Kidney disease Father    History  Substance Use Topics  . Smoking status: Never Smoker   . Smokeless tobacco: Never Used  . Alcohol Use: No   OB History   Grav Para Term Preterm Abortions TAB SAB Ect Mult Living                 Review of Systems  All other systems reviewed and are negative.     Allergies  Review of patient's allergies indicates no known allergies.  Home Medications   Prior to Admission medications   Medication Sig Start Date End Date Taking? Authorizing Provider  amantadine (SYMMETREL) 100 MG capsule Take 100 mg by mouth every morning.     Historical Provider, MD  atorvastatin (LIPITOR) 20 MG tablet Take 20 mg by mouth  daily.     Historical Provider, MD  calcium-vitamin D (OSCAL 500/200 D-3) 500-200 MG-UNIT per tablet Take 1 tablet by mouth 2 (two) times daily.    Historical Provider, MD  carvedilol (COREG) 3.125 MG tablet Take 1 tablet (3.125 mg total) by mouth 2 (two) times daily with a meal. 10/08/12   Catarina Hartshornavid Tat, MD  fish oil-omega-3 fatty acids 1000 MG capsule Take 1 g by mouth 2 (two) times daily.     Historical Provider, MD  furosemide (LASIX) 20 MG tablet Take 1 tablet (20 mg total) by mouth every other day. 10/06/12   Hollice EspySendil K Krishnan, MD  lisinopril (PRINIVIL,ZESTRIL) 10 MG tablet Take 15 mg by mouth every morning.     Historical Provider, MD  mirtazapine (REMERON) 30 MG tablet Take 30 mg by mouth at bedtime.    Historical Provider, MD  oxymetazoline (AFRIN NASAL SPRAY) 0.05 % nasal spray Place 2 sprays into both nostrils as needed (for nose bleed). 10/21/13   Vivi BarrackSarah Cater, MD  perphenazine (TRILAFON) 8 MG tablet Take 16 mg by mouth at bedtime.     Historical Provider, MD  Rivaroxaban (XARELTO) 20 MG TABS Take 1 tablet (20 mg total) by mouth daily. 10/27/12   Hollice EspySendil K Krishnan, MD   BP 132/80  Pulse 89  Temp(Src) 98.6 F (37 C) (Oral)  Resp 16  SpO2 94% Physical Exam  Nursing note and vitals reviewed. Constitutional: She appears well-developed and well-nourished. No distress.  HENT:  Head: Normocephalic and atraumatic.  Right Ear: External ear normal.  Left Ear: External ear normal.  Dried blood around left nostril, small clot noted anterior aspect left nasal septum, no active bleeding  Eyes: Conjunctivae are normal. Right eye exhibits no discharge. Left eye exhibits no discharge. No scleral icterus.  Neck: Neck supple. No tracheal deviation present.  Cardiovascular: Normal rate, regular rhythm and intact distal pulses.   Pulmonary/Chest: Effort normal and breath sounds normal. No stridor. No respiratory distress. She has no wheezes. She has no rales.  Abdominal: Soft. Bowel sounds are normal. She  exhibits no distension. There is no tenderness. There is no rebound and no guarding.  Musculoskeletal: She exhibits no edema and no tenderness.  Neurological: She is alert. She has normal strength. No cranial nerve deficit (no facial droop, extraocular movements intact, no slurred speech) or sensory deficit. She exhibits normal muscle tone. She displays no seizure activity. Coordination normal.  Skin: Skin is warm and dry. No rash noted.  Psychiatric: She has a normal mood and affect.    ED Course  EPISTAXIS MANAGEMENT Date/Time: 10/27/2013 11:32 AM Performed by: Linwood DibblesKNAPP, Gustaf Mccarter R Authorized by: Linwood DibblesKNAPP, Mirela Parsley R Consent: Verbal consent obtained. Consent given by: patient Patient identity confirmed: verbally with patient Patient sedated: no Treatment site: left anterior Treatment complexity: simple Patient tolerance: Patient tolerated the procedure well with no immediate complications.    Labs Review Labs Reviewed  CBC WITH DIFFERENTIAL   1000  Still without any bleeding.  Cotton swab saoked in lidocaine and afrin placed in left anterior nares.  Tolerated well. 1047  Removed the cotton swab.  Large clot removed from nares.   No active bleeding noted on my exam but pt feels she has some drainage in the back of her throat. 1130  Discussed with Dr Suszanne Connerseoh.  I did place a nasal packing in the left nares as he recommended.  Follow up in his office on Wednesday. MDM   Final diagnoses:  Epistaxis, recurrent    Pt with recurrent nasal bleeding.  On anticoagulants.  No active bleeding in the ED.  Follow up with ent closely.    Celene KrasJon R Ardyth Kelso, MD 10/27/13 [phone]

## 2013-10-27 NOTE — ED Notes (Signed)
Bed: WTR6 Expected date:  Expected time:  Means of arrival:  Comments: EMS/nosebleed

## 2013-10-27 NOTE — ED Notes (Signed)
[facility] called to notify them of pt's return; [facility] called for transport

## 2013-10-27 NOTE — ED Notes (Addendum)
[facility] EMS [facility].  The first one woke her up around 0100 this morning and then stopped then again when she was getting dress.  Pt does have PMH HTN but not on blood thinners.  CBG 138

## 2013-10-29 ENCOUNTER — Encounter (HOSPITAL_COMMUNITY): Payer: Self-pay | Admitting: Emergency Medicine

## 2013-10-29 ENCOUNTER — Emergency Department (HOSPITAL_COMMUNITY)
Admission: EM | Admit: 2013-10-29 | Discharge: 2013-10-29 | Disposition: A | Discharge status: Home / Self Care | Payer: Medicare Other | Attending: Emergency Medicine | Admitting: Emergency Medicine

## 2013-10-29 DIAGNOSIS — Z79899 Other long term (current) drug therapy: Secondary | ICD-10-CM | POA: Insufficient documentation

## 2013-10-29 DIAGNOSIS — R040 Epistaxis: Secondary | ICD-10-CM

## 2013-10-29 DIAGNOSIS — E785 Hyperlipidemia, unspecified: Secondary | ICD-10-CM | POA: Insufficient documentation

## 2013-10-29 DIAGNOSIS — I10 Essential (primary) hypertension: Secondary | ICD-10-CM | POA: Insufficient documentation

## 2013-10-29 DIAGNOSIS — Z8659 Personal history of other mental and behavioral disorders: Secondary | ICD-10-CM | POA: Insufficient documentation

## 2013-10-29 DIAGNOSIS — Z86711 Personal history of pulmonary embolism: Secondary | ICD-10-CM | POA: Insufficient documentation

## 2013-10-29 DIAGNOSIS — I5030 Unspecified diastolic (congestive) heart failure: Secondary | ICD-10-CM | POA: Insufficient documentation

## 2013-10-29 DIAGNOSIS — E119 Type 2 diabetes mellitus without complications: Secondary | ICD-10-CM | POA: Insufficient documentation

## 2013-10-29 DIAGNOSIS — Z7901 Long term (current) use of anticoagulants: Secondary | ICD-10-CM | POA: Insufficient documentation

## 2013-10-29 NOTE — Discharge Instructions (Signed)

## 2013-10-29 NOTE — ED Notes (Signed)
[phone] at facility, unable to provide transport to pt's appt as "someone else already has an appt at that time".  Dr. Wilkie AyeHorton updated and to call son.

## 2013-10-29 NOTE — ED Notes (Signed)
Per EMS, patient has chronic nose bleeds-was seen on Monday and had balloon placed in left nares-bleeding around it-MD at facility wanted her evaluated

## 2013-10-29 NOTE — ED Notes (Signed)
Pt's grandson here to provide transport to pt's appt.  NAD upon leaving dept.

## 2013-10-29 NOTE — ED Provider Notes (Signed)
CSN: [id number]     Arrival date & time 10/29/13  1001 History   First MD Initiated Contact with Patient 10/29/13 1009     Chief Complaint  Patient presents with  . Epistaxis     (Consider location/radiation/quality/duration/timing/severity/associated sxs/prior Treatment) HPI  This is a [age] rolled female with a history of hypertension, diabetes, hyperlipidemia and PE currently on several to who presents with recurrent epistaxis. Patient was seen and evaluated on Monday and had nasal packing placed in the left nare.  She states that this morning she noted bleeding around the nasal packing. She's not noted any bleeding in the back of her throat or out of the right nare.  Patient denies any other symptoms. She was supposed to followup with ENT today but has not made an appointment.  Past Medical History  Diagnosis Date  . Hypertension   . Diabetes mellitus   . Schizophrenia   . Hyperlipemia   . PE (pulmonary embolism)   . Pulmonary infarction   . Diastolic heart failure   . Urinary retention    Past Surgical History  Procedure Laterality Date  . Cholecystectomy    . Abdominal hysterectomy     Family History  Problem Relation Age of Onset  . Heart disease Mother   . Kidney disease Father    History  Substance Use Topics  . Smoking status: Never Smoker   . Smokeless tobacco: Never Used  . Alcohol Use: No   OB History   Grav Para Term Preterm Abortions TAB SAB Ect Mult Living                 Review of Systems  Constitutional: Negative for fever.  HENT: Positive for nosebleeds.   Respiratory: Negative for chest tightness and shortness of breath.   Cardiovascular: Negative for chest pain.  Genitourinary: Negative for dysuria.  Neurological: Negative for headaches.  All other systems reviewed and are negative.     Allergies  No known allergies  Home Medications   Prior to Admission medications   Medication Sig Start Date End Date Taking? Authorizing Provider   amantadine (SYMMETREL) 100 MG capsule Take 100 mg by mouth every morning.    Yes Historical Provider, MD  atorvastatin (LIPITOR) 20 MG tablet Take 20 mg by mouth daily.    Yes Historical Provider, MD  calcium-vitamin D (OSCAL 500/200 D-3) 500-200 MG-UNIT per tablet Take 1 tablet by mouth daily.    Yes Historical Provider, MD  carvedilol (COREG) 3.125 MG tablet Take 1 tablet (3.125 mg total) by mouth 2 (two) times daily with a meal. 10/08/12  Yes Catarina Hartshornavid Tat, MD  fish oil-omega-3 fatty acids 1000 MG capsule Take 1 g by mouth 2 (two) times daily.    Yes Historical Provider, MD  furosemide (LASIX) 20 MG tablet Take 20 mg by mouth daily. Takes on even days   Yes Historical Provider, MD  lisinopril (PRINIVIL,ZESTRIL) 10 MG tablet Take 15 mg by mouth every morning.    Yes Historical Provider, MD  mirtazapine (REMERON) 30 MG tablet Take 30 mg by mouth daily.    Yes Historical Provider, MD  oxymetazoline (AFRIN NASAL SPRAY) 0.05 % nasal spray Place 2 sprays into both nostrils as needed (for nose bleed). 10/21/13  Yes Vivi BarrackSarah Cater, MD  perphenazine (TRILAFON) 16 MG tablet Take 16 mg by mouth at bedtime.   Yes Historical Provider, MD  rivaroxaban (XARELTO) 20 MG TABS tablet Take 20 mg by mouth at bedtime.   Yes Historical Provider, MD  BP 154/88  Pulse 69  Temp(Src) 99.1 F (37.3 C) (Oral)  Resp 18  SpO2 92% Physical Exam  Nursing note and vitals reviewed. Constitutional: She is oriented to person, place, and time. No distress.  Elderly  HENT:  Head: Normocephalic and atraumatic.  Nasal packing noted in the left nare, dried blood noted around the packing and on the upper lip, no active bleeding noted, oropharynx without evidence of bleeding  Cardiovascular: Normal rate and regular rhythm.   Pulmonary/Chest: Effort normal. No respiratory distress.  Neurological: She is alert and oriented to person, place, and time.  Skin: Skin is warm and dry.  Psychiatric: She has a normal mood and affect.    ED  Course  Procedures (including critical care time) Labs Review Labs Reviewed - No data to display  Imaging Review No results found.   EKG Interpretation None      MDM   Final diagnoses:  Recurrent epistaxis    Patient presents with recurrent epistaxis.  She reports bleeding around the packing currently bleeding has stopped. No evidence of active bleeding on exam. Patient is on Xarelto. Discussed again with Dr. Suszanne Connerseoh who will see the patient in his office this afternoon at 2:10 PM.  After history, exam, and medical workup I feel the patient has been appropriately medically screened and is safe for discharge home. Pertinent diagnoses were discussed with the patient. Patient was given return precautions.     Shon Batonourtney F Britanie Harshman, MD 10/29/13 1044

## 2013-10-29 NOTE — ED Notes (Signed)
Bed: WU98WA11 Expected date:  Expected time:  Means of arrival:  Comments: EMS/recalcitrant epistaxis

## 2013-10-29 NOTE — ED Notes (Signed)
MD at bedside. 

## 2013-10-29 NOTE — ED Notes (Signed)
Initial Contact - pt resting on stretcher, nasal packing in place to L nare.  No bleeding noted at this time.  Pt denies needs/complaints.  Skin PWD.  Awake, alert.  Speaking full sentences.  NAD.  Awaiting family and d/c to follow up with ENT.  NAD.

## 2013-10-29 NOTE — ED Notes (Signed)
Call placed to pt's son at this time as he still hadn't arrive to take pt to appt, as was relayed in nursing report.  Pt's son sts "at work all day and unable to take her to appt".  Pt's son to call facility as they provide transport to appt's.  Awaiting call from facility at this time to confirm.

## 2013-10-29 NOTE — ED Notes (Signed)
Spoke with MD. Pt to be discharged with son when son arrives.

## 2013-10-29 NOTE — ED Notes (Signed)
Pt assisted to ambulate to void in bathroom.  Denies further needs/complaints at this time.  NAD.

## 2014-01-08 ENCOUNTER — Emergency Department (HOSPITAL_COMMUNITY)
Admission: EM | Admit: 2014-01-08 | Discharge: 2014-01-08 | Disposition: A | Discharge status: Home / Self Care | Payer: Medicare Other | Attending: Emergency Medicine | Admitting: Emergency Medicine

## 2014-01-08 ENCOUNTER — Encounter (HOSPITAL_COMMUNITY): Payer: Self-pay | Admitting: Emergency Medicine

## 2014-01-08 ENCOUNTER — Emergency Department (HOSPITAL_COMMUNITY): Payer: Medicare Other

## 2014-01-08 DIAGNOSIS — Z7901 Long term (current) use of anticoagulants: Secondary | ICD-10-CM | POA: Insufficient documentation

## 2014-01-08 DIAGNOSIS — E119 Type 2 diabetes mellitus without complications: Secondary | ICD-10-CM | POA: Diagnosis not present

## 2014-01-08 DIAGNOSIS — W1809XA Striking against other object with subsequent fall, initial encounter: Secondary | ICD-10-CM | POA: Diagnosis not present

## 2014-01-08 DIAGNOSIS — Y929 Unspecified place or not applicable: Secondary | ICD-10-CM | POA: Insufficient documentation

## 2014-01-08 DIAGNOSIS — Z79899 Other long term (current) drug therapy: Secondary | ICD-10-CM | POA: Insufficient documentation

## 2014-01-08 DIAGNOSIS — W19XXXA Unspecified fall, initial encounter: Secondary | ICD-10-CM

## 2014-01-08 DIAGNOSIS — S1093XA Contusion of unspecified part of neck, initial encounter: Secondary | ICD-10-CM | POA: Diagnosis not present

## 2014-01-08 DIAGNOSIS — Z86711 Personal history of pulmonary embolism: Secondary | ICD-10-CM | POA: Insufficient documentation

## 2014-01-08 DIAGNOSIS — S0990XA Unspecified injury of head, initial encounter: Secondary | ICD-10-CM | POA: Insufficient documentation

## 2014-01-08 DIAGNOSIS — I5030 Unspecified diastolic (congestive) heart failure: Secondary | ICD-10-CM | POA: Diagnosis not present

## 2014-01-08 DIAGNOSIS — Y9389 Activity, other specified: Secondary | ICD-10-CM | POA: Diagnosis not present

## 2014-01-08 DIAGNOSIS — I10 Essential (primary) hypertension: Secondary | ICD-10-CM | POA: Insufficient documentation

## 2014-01-08 DIAGNOSIS — Z8659 Personal history of other mental and behavioral disorders: Secondary | ICD-10-CM | POA: Diagnosis not present

## 2014-01-08 DIAGNOSIS — S0083XA Contusion of other part of head, initial encounter: Principal | ICD-10-CM | POA: Insufficient documentation

## 2014-01-08 DIAGNOSIS — S0003XA Contusion of scalp, initial encounter: Secondary | ICD-10-CM | POA: Insufficient documentation

## 2014-01-08 DIAGNOSIS — E785 Hyperlipidemia, unspecified: Secondary | ICD-10-CM | POA: Insufficient documentation

## 2014-01-08 LAB — URINALYSIS, ROUTINE W REFLEX MICROSCOPIC
BILIRUBIN URINE: NEGATIVE
Glucose, UA: NEGATIVE mg/dL
Hgb urine dipstick: NEGATIVE
Ketones, ur: NEGATIVE mg/dL
Leukocytes, UA: NEGATIVE
NITRITE: NEGATIVE
Protein, ur: NEGATIVE mg/dL
SPECIFIC GRAVITY, URINE: 1.02 (ref 1.005–1.030)
UROBILINOGEN UA: 0.2 mg/dL (ref 0.0–1.0)
pH: 6 (ref 5.0–8.0)

## 2014-01-08 LAB — CBC WITH DIFFERENTIAL/PLATELET
BASOS ABS: 0 10*3/uL (ref 0.0–0.1)
Basophils Relative: 0 % (ref 0–1)
Eosinophils Absolute: 0.1 10*3/uL (ref 0.0–0.7)
Eosinophils Relative: 2 % (ref 0–5)
HCT: 48.6 % — ABNORMAL HIGH (ref 36.0–46.0)
HEMOGLOBIN: 16.4 g/dL — AB (ref 12.0–15.0)
Lymphocytes Relative: 32 % (ref 12–46)
Lymphs Abs: 2.4 10*3/uL (ref 0.7–4.0)
MCH: 32.1 pg (ref 26.0–34.0)
MCHC: 33.7 g/dL (ref 30.0–36.0)
MCV: 95.1 fL (ref 78.0–100.0)
Monocytes Absolute: 0.7 10*3/uL (ref 0.1–1.0)
Monocytes Relative: 10 % (ref 3–12)
NEUTROS ABS: 4.4 10*3/uL (ref 1.7–7.7)
NEUTROS PCT: 56 % (ref 43–77)
Platelets: 218 10*3/uL (ref 150–400)
RBC: 5.11 MIL/uL (ref 3.87–5.11)
RDW: 12.8 % (ref 11.5–15.5)
WBC: 7.7 10*3/uL (ref 4.0–10.5)

## 2014-01-08 LAB — BASIC METABOLIC PANEL
ANION GAP: 12 (ref 5–15)
BUN: 13 mg/dL (ref 6–23)
CHLORIDE: 96 meq/L (ref 96–112)
CO2: 30 mEq/L (ref 19–32)
Calcium: 10.3 mg/dL (ref 8.4–10.5)
Creatinine, Ser: 0.78 mg/dL (ref 0.50–1.10)
GFR calc non Af Amer: 79 mL/min — ABNORMAL LOW (ref >90)
Glucose, Bld: 122 mg/dL — ABNORMAL HIGH (ref 70–99)
POTASSIUM: 4.1 meq/L (ref 3.7–5.3)
SODIUM: 138 meq/L (ref 137–147)

## 2014-01-08 NOTE — ED Notes (Signed)
Bed: WA19 Expected date:  Expected time:  Means of arrival:  Comments: fall 

## 2014-01-08 NOTE — ED Provider Notes (Signed)
CSN: [id number]     Arrival date & time 01/08/14  1612 History   First MD Initiated Contact with Patient 01/08/14 1619     Chief Complaint  Patient presents with  . Fall     (Consider location/radiation/quality/duration/timing/severity/associated sxs/prior Treatment) The history is provided by the patient.    Patient presents after accidental fall.  States she was going to sit down to eat but wanted to move the chair into a different place first, states she reached out for the chair but accidentally over reached and fell, striking the right side of her head.  Denies any pain anywhere.  Denies LOC.  Denies lightheadedness, dizziness, chest pain, SOB, weakness or numbness of the extremities.  Reports urinary frequency that has been ongoing for some time.  States she does have lightheadedness chronically when getting up quickly, this is unchanged, she did not have this at the time of the fall.   Past Medical History  Diagnosis Date  . Hypertension   . Diabetes mellitus   . Schizophrenia   . Hyperlipemia   . PE (pulmonary embolism)   . Pulmonary infarction   . Diastolic heart failure   . Urinary retention    Past Surgical History  Procedure Laterality Date  . Cholecystectomy    . Abdominal hysterectomy     Family History  Problem Relation Age of Onset  . Heart disease Mother   . Kidney disease Father    History  Substance Use Topics  . Smoking status: Never Smoker   . Smokeless tobacco: Never Used  . Alcohol Use: No   OB History   Grav Para Term Preterm Abortions TAB SAB Ect Mult Living                 Review of Systems  All other systems reviewed and are negative.     Allergies  Review of patient's allergies indicates no known allergies.  Home Medications   Prior to Admission medications   Medication Sig Start Date End Date Taking? Authorizing Provider  amantadine (SYMMETREL) 100 MG capsule Take 100 mg by mouth every morning.    Yes Historical Provider, MD    atorvastatin (LIPITOR) 20 MG tablet Take 20 mg by mouth daily.    Yes Historical Provider, MD  calcium-vitamin D (OSCAL 500/200 D-3) 500-200 MG-UNIT per tablet Take 1 tablet by mouth daily.    Yes Historical Provider, MD  carvedilol (COREG) 3.125 MG tablet Take 1 tablet (3.125 mg total) by mouth 2 (two) times daily with a meal. 10/08/12  Yes Catarina Hartshorn, MD  fish oil-omega-3 fatty acids 1000 MG capsule Take 1 g by mouth 2 (two) times daily.    Yes Historical Provider, MD  furosemide (LASIX) 20 MG tablet Take 20 mg by mouth every other day. Takes on even days   Yes Historical Provider, MD  lisinopril (PRINIVIL,ZESTRIL) 10 MG tablet Take 15 mg by mouth every morning.    Yes Historical Provider, MD  mirtazapine (REMERON) 30 MG tablet Take 30 mg by mouth daily with breakfast.    Yes Historical Provider, MD  oxymetazoline (AFRIN NASAL SPRAY) 0.05 % nasal spray Place 2 sprays into both nostrils as needed (for nose bleed). 10/21/13  Yes Vivi Barrack, MD  perphenazine (TRILAFON) 16 MG tablet Take 16 mg by mouth at bedtime.   Yes Historical Provider, MD  rivaroxaban (XARELTO) 20 MG TABS tablet Take 20 mg by mouth at bedtime.   Yes Historical Provider, MD   BP 156/127  Pulse  54  Temp(Src) 97.7 F (36.5 C) (Oral)  Resp 20  SpO2 89% Physical Exam  Nursing note and vitals reviewed. Constitutional: She appears well-developed and well-nourished. No distress.  HENT:  Head: Normocephalic. Head is without raccoon's eyes and without Battle's sign.    Neck: Neck supple.  Cardiovascular: Normal rate and regular rhythm.   Pulmonary/Chest: Effort normal and breath sounds normal. No respiratory distress. She has no wheezes. She has no rales. She exhibits no tenderness.  Abdominal: Soft. She exhibits no distension. There is no tenderness. There is no rebound and no guarding.  Musculoskeletal:  No bony tenderness on exam.    Neurological: She is alert. She exhibits normal muscle tone. GCS eye subscore is 4. GCS  verbal subscore is 5. GCS motor subscore is 6.  Moves all extremities equally.   Skin: She is not diaphoretic.    ED Course  Procedures (including critical care time) Labs Review Labs Reviewed  CBC WITH DIFFERENTIAL - Abnormal; Notable for the following:    Hemoglobin 16.4 (*)    HCT 48.6 (*)    All other components within normal limits  BASIC METABOLIC PANEL - Abnormal; Notable for the following:    Glucose, Bld 122 (*)    GFR calc non Af Amer 79 (*)    All other components within normal limits  URINALYSIS, ROUTINE W REFLEX MICROSCOPIC    Imaging Review Ct Head Wo Contrast  01/08/2014   CLINICAL DATA:  Status post fall.  EXAM: CT HEAD WITHOUT CONTRAST  CT CERVICAL SPINE WITHOUT CONTRAST  TECHNIQUE: Multidetector CT imaging of the head and cervical spine was performed following the standard protocol without intravenous contrast. Multiplanar CT image reconstructions of the cervical spine were also generated.  COMPARISON:  Head CT scan 10/02/2012. Head and cervical spine CT scan 12/12/2010.  FINDINGS: CT HEAD FINDINGS  Patchy and confluent hypoattenuation in the subcortical and periventricular deep white matter consistent with chronic microvascular ischemic change is stable in appearance compared to the most recent study. No evidence of acute abnormality including infarction, hemorrhage, mass lesion, mass effect, midline shift or abnormal extra-axial fluid collection is identified. There is no hydrocephalus or pneumocephalus. The calvarium is intact. Imaged paranasal sinuses and mastoid air cells are clear.  CT CERVICAL SPINE FINDINGS  No fracture or malalignment is identified. There is loss of disc space height most notable at C4-5 and C5-6. Scattered mild facet arthropathy is seen. Lung apices are clear.  IMPRESSION: No acute finding head or cervical spine.   Electronically Signed   By: Drusilla Kanner M.D.   On: 01/08/2014 17:22   Ct Cervical Spine Wo Contrast  01/08/2014   CLINICAL DATA:   Status post fall.  EXAM: CT HEAD WITHOUT CONTRAST  CT CERVICAL SPINE WITHOUT CONTRAST  TECHNIQUE: Multidetector CT imaging of the head and cervical spine was performed following the standard protocol without intravenous contrast. Multiplanar CT image reconstructions of the cervical spine were also generated.  COMPARISON:  Head CT scan 10/02/2012. Head and cervical spine CT scan 12/12/2010.  FINDINGS: CT HEAD FINDINGS  Patchy and confluent hypoattenuation in the subcortical and periventricular deep white matter consistent with chronic microvascular ischemic change is stable in appearance compared to the most recent study. No evidence of acute abnormality including infarction, hemorrhage, mass lesion, mass effect, midline shift or abnormal extra-axial fluid collection is identified. There is no hydrocephalus or pneumocephalus. The calvarium is intact. Imaged paranasal sinuses and mastoid air cells are clear.  CT CERVICAL SPINE FINDINGS  No fracture or malalignment is identified. There is loss of disc space height most notable at C4-5 and C5-6. Scattered mild facet arthropathy is seen. Lung apices are clear.  IMPRESSION: No acute finding head or cervical spine.   Electronically Signed   By: Drusilla Kannerhomas  Dalessio M.D.   On: 01/08/2014 17:22     EKG Interpretation None       Discussed pt with Dr Wilkie AyeHorton who will also see the patient.  MDM   Final diagnoses:  Fall, initial encounter    Pt with mechanical fall without LOC.  Small mark on right scalp from fall.  CT head, c-spine negative.  Labs, UA unremarkable.  Pt without complaints.  Pt noted to be hypoxic occasionally but pt denied SOB.  Also noted to be hypertensive - pt without CP, SOB, AMS, HA, neurologic symptoms.  Renal function normal. Doubt hypertensive emergency. Discussed pt with Dr Wilkie AyeHorton.  D/C back to facility.  Discussed result, findings, treatment, and follow up  with patient.  Pt given return precautions.  Pt verbalizes understanding and agrees  with plan.        RayvilleEmily Aries Kasa, PA-C 01/08/14 2302

## 2014-01-08 NOTE — ED Notes (Signed)
Per EMS-fell when sitting down to eat lunch. Became dizzy and hit head on ground. No LOC. Hx dizziness and states she has seen MD before but wasn't given medications for it. No c/o pain, dizziness, numbness, tingling for EMS. Denies neck, back pain. VS BP 130 palp, 72 HR, 111 CBG. Not on blood thinners. Hx schizophrenia.

## 2014-01-08 NOTE — Discharge Instructions (Signed)
Read the information below.  You may return to the Emergency Department at any time for worsening condition or any new symptoms that concern you.    You have had a head injury which does not appear to require admission at this time. A concussion is a state of changed mental ability from trauma. SEEK IMMEDIATE MEDICAL ATTENTION IF: There is confusion or drowsiness (although children frequently become drowsy after injury).  You cannot awaken the injured person.  There is nausea (feeling sick to your stomach) or continued, forceful vomiting.  You notice dizziness or unsteadiness which is getting worse, or inability to walk.  You have convulsions or unconsciousness.  You experience severe, persistent headaches not relieved by Tylenol?. (Do not take aspirin as this impairs clotting abilities). Take other pain medications only as directed.  You cannot use arms or legs normally.  There are changes in pupil sizes. (This is the black center in the colored part of the eye)  There is clear or bloody discharge from the nose or ears.  Change in speech, vision, swallowing, or understanding.  Localized weakness, numbness, tingling, or change in bowel or bladder control.   Fall Prevention and Home Safety Falls cause injuries and can affect all age groups. It is possible to use preventive measures to significantly decrease the likelihood of falls. There are many simple measures which can make your home safer and prevent falls. OUTDOORS  Repair cracks and edges of walkways and driveways.  Remove high doorway thresholds.  Trim shrubbery on the main path into your home.  Have good outside lighting.  Clear walkways of tools, rocks, debris, and clutter.  Check that handrails are not broken and are securely fastened. Both sides of steps should have handrails.  Have leaves, snow, and ice cleared regularly.  Use sand or salt on walkways during winter months.  In the garage, clean up grease or oil  spills. BATHROOM  Install night lights.  Install grab bars by the toilet and in the tub and shower.  Use non-skid mats or decals in the tub or shower.  Place a plastic non-slip stool in the shower to sit on, if needed.  Keep floors dry and clean up all water on the floor immediately.  Remove soap buildup in the tub or shower on a regular basis.  Secure bath mats with non-slip, double-sided rug tape.  Remove throw rugs and tripping hazards from the floors. BEDROOMS  Install night lights.  Make sure a bedside light is easy to reach.  Do not use oversized bedding.  Keep a telephone by your bedside.  Have a firm chair with side arms to use for getting dressed.  Remove throw rugs and tripping hazards from the floor. KITCHEN  Keep handles on pots and pans turned toward the center of the stove. Use back burners when possible.  Clean up spills quickly and allow time for drying.  Avoid walking on wet floors.  Avoid hot utensils and knives.  Position shelves so they are not too high or low.  Place commonly used objects within easy reach.  If necessary, use a sturdy step stool with a grab bar when reaching.  Keep electrical cables out of the way.  Do not use floor polish or wax that makes floors slippery. If you must use wax, use non-skid floor wax.  Remove throw rugs and tripping hazards from the floor. STAIRWAYS  Never leave objects on stairs.  Place handrails on both sides of stairways and use them. Fix any  loose handrails. Make sure handrails on both sides of the stairways are as long as the stairs.  Check carpeting to make sure it is firmly attached along stairs. Make repairs to worn or loose carpet promptly.  Avoid placing throw rugs at the top or bottom of stairways, or properly secure the rug with carpet tape to prevent slippage. Get rid of throw rugs, if possible.  Have an electrician put in a light switch at the top and bottom of the stairs. OTHER FALL  PREVENTION TIPS  Wear low-heel or rubber-soled shoes that are supportive and fit well. Wear closed toe shoes.  When using a stepladder, make sure it is fully opened and both spreaders are firmly locked. Do not climb a closed stepladder.  Add color or contrast paint or tape to grab bars and handrails in your home. Place contrasting color strips on first and last steps.  Learn and use mobility aids as needed. Install an electrical emergency response system.  Turn on lights to avoid dark areas. Replace light bulbs that burn out immediately. Get light switches that glow.  Arrange furniture to create clear pathways. Keep furniture in the same place.  Firmly attach carpet with non-skid or double-sided tape.  Eliminate uneven floor surfaces.  Select a carpet pattern that does not visually hide the edge of steps.  Be aware of all pets. OTHER HOME SAFETY TIPS  Set the water temperature for 120 F (48.8 C).  Keep emergency numbers on or near the telephone.  Keep smoke detectors on every level of the home and near sleeping areas. Document Released: 05/26/2002 Document Revised: 12/05/2011 Document Reviewed: 08/25/2011 [facility] Patient Information 2015 [place], [place]. This information is not intended to replace advice given to you by your health care provider. Make sure you discuss any questions you have with your health care provider.

## 2014-01-09 NOTE — ED Provider Notes (Signed)
Date: 01/09/2014  Rate: 81  Rhythm: normal sinus rhythm  QRS Axis: left  Intervals: normal  ST/T Wave abnormalities: normal  Conduction Disutrbances:left anterior fascicular block  Narrative Interpretation:   Old EKG Reviewed: unchanged    Trixie DredgeEmily Ednamae Schiano, PA-C 01/09/14 0136

## 2014-01-10 NOTE — ED Provider Notes (Signed)
Medical screening examination/treatment/procedure(s) were conducted as a shared visit with non-physician practitioner(s) and myself.  I personally evaluated the patient during the encounter.   EKG Interpretation   Date/Time:  Thursday January 08 2014 16:26:19 EDT Ventricular Rate:  81 PR Interval:  165 QRS Duration: 90 QT Interval:  366 QTC Calculation: 425 R Axis:   -76 Text Interpretation:  Unknown rhythm, irregular rate Left anterior  fascicular block Consider anterior infarct ED PHYSICIAN INTERPRETATION  AVAILABLE IN [facility] Confirmed by TEST, Record ([id number]) on  01/10/2014 7:13:48 AM        Shon Batonourtney F Horton, MD 01/10/14 1642

## 2014-01-10 NOTE — ED Provider Notes (Signed)
Medical screening examination/treatment/procedure(s) were conducted as a shared visit with non-physician practitioner(s) and myself.  I personally evaluated the patient during the encounter.   EKG Interpretation   Date/Time:  Thursday January 08 2014 16:26:19 EDT Ventricular Rate:  81 PR Interval:  165 QRS Duration: 90 QT Interval:  366 QTC Calculation: 425 R Axis:   -76 Text Interpretation:  Unknown rhythm, irregular rate Left anterior  fascicular block Consider anterior infarct ED PHYSICIAN INTERPRETATION  AVAILABLE IN [facility] Confirmed by TEST, Record ([id number]) on  01/10/2014 7:13:48 AM      Patient presents following mechanical fall.  Nontoxic and no focal deficits.  Patient without complaints. IMaging neg.  Patient to be discharged home.  Shon Batonourtney F Diaz Crago, MD 01/10/14 [phone]

## 2014-02-24 ENCOUNTER — Emergency Department (HOSPITAL_COMMUNITY)
Admission: EM | Admit: 2014-02-24 | Discharge: 2014-02-24 | Disposition: A | Discharge status: Home / Self Care | Payer: Medicare Other | Attending: Emergency Medicine | Admitting: Emergency Medicine

## 2014-02-24 ENCOUNTER — Encounter (HOSPITAL_COMMUNITY): Payer: Self-pay | Admitting: Emergency Medicine

## 2014-02-24 ENCOUNTER — Emergency Department (HOSPITAL_COMMUNITY): Payer: Medicare Other

## 2014-02-24 DIAGNOSIS — R5383 Other fatigue: Secondary | ICD-10-CM | POA: Diagnosis present

## 2014-02-24 DIAGNOSIS — Z86711 Personal history of pulmonary embolism: Secondary | ICD-10-CM | POA: Insufficient documentation

## 2014-02-24 DIAGNOSIS — I5030 Unspecified diastolic (congestive) heart failure: Secondary | ICD-10-CM | POA: Insufficient documentation

## 2014-02-24 DIAGNOSIS — F209 Schizophrenia, unspecified: Secondary | ICD-10-CM | POA: Insufficient documentation

## 2014-02-24 DIAGNOSIS — I498 Other specified cardiac arrhythmias: Secondary | ICD-10-CM | POA: Insufficient documentation

## 2014-02-24 DIAGNOSIS — E119 Type 2 diabetes mellitus without complications: Secondary | ICD-10-CM | POA: Diagnosis not present

## 2014-02-24 DIAGNOSIS — I10 Essential (primary) hypertension: Secondary | ICD-10-CM | POA: Insufficient documentation

## 2014-02-24 DIAGNOSIS — Z7901 Long term (current) use of anticoagulants: Secondary | ICD-10-CM | POA: Diagnosis not present

## 2014-02-24 DIAGNOSIS — Z79899 Other long term (current) drug therapy: Secondary | ICD-10-CM | POA: Diagnosis not present

## 2014-02-24 DIAGNOSIS — E860 Dehydration: Secondary | ICD-10-CM | POA: Diagnosis not present

## 2014-02-24 DIAGNOSIS — R5381 Other malaise: Secondary | ICD-10-CM | POA: Insufficient documentation

## 2014-02-24 LAB — URINALYSIS, ROUTINE W REFLEX MICROSCOPIC
Bilirubin Urine: NEGATIVE
Glucose, UA: NEGATIVE mg/dL
HGB URINE DIPSTICK: NEGATIVE
KETONES UR: NEGATIVE mg/dL
Leukocytes, UA: NEGATIVE
Nitrite: NEGATIVE
PROTEIN: NEGATIVE mg/dL
Specific Gravity, Urine: 1.038 — ABNORMAL HIGH (ref 1.005–1.030)
Urobilinogen, UA: 0.2 mg/dL (ref 0.0–1.0)
pH: 6 (ref 5.0–8.0)

## 2014-02-24 LAB — BASIC METABOLIC PANEL
Anion gap: 11 (ref 5–15)
BUN: 19 mg/dL (ref 6–23)
CO2: 28 meq/L (ref 19–32)
Calcium: 9.1 mg/dL (ref 8.4–10.5)
Chloride: 98 mEq/L (ref 96–112)
Creatinine, Ser: 0.76 mg/dL (ref 0.50–1.10)
GFR calc Af Amer: >90 mL/min (ref >90)
GFR calc non Af Amer: 80 mL/min — ABNORMAL LOW (ref >90)
Glucose, Bld: 106 mg/dL — ABNORMAL HIGH (ref 70–99)
Potassium: 4.2 mEq/L (ref 3.7–5.3)
Sodium: 137 mEq/L (ref 137–147)

## 2014-02-24 LAB — DIFFERENTIAL
BASOS ABS: 0 10*3/uL (ref 0.0–0.1)
Basophils Relative: 0 % (ref 0–1)
Eosinophils Absolute: 0.2 10*3/uL (ref 0.0–0.7)
Eosinophils Relative: 3 % (ref 0–5)
LYMPHS ABS: 2.1 10*3/uL (ref 0.7–4.0)
LYMPHS PCT: 30 % (ref 12–46)
Monocytes Absolute: 0.9 10*3/uL (ref 0.1–1.0)
Monocytes Relative: 12 % (ref 3–12)
NEUTROS ABS: 4 10*3/uL (ref 1.7–7.7)
Neutrophils Relative %: 55 % (ref 43–77)

## 2014-02-24 LAB — TSH: TSH: 2.38 u[IU]/mL (ref 0.350–4.500)

## 2014-02-24 LAB — I-STAT TROPONIN, ED: Troponin i, poc: 0 ng/mL (ref 0.00–0.08)

## 2014-02-24 LAB — CBC
HCT: 45 % (ref 36.0–46.0)
Hemoglobin: 15.3 g/dL — ABNORMAL HIGH (ref 12.0–15.0)
MCH: 32.3 pg (ref 26.0–34.0)
MCHC: 34 g/dL (ref 30.0–36.0)
MCV: 95.1 fL (ref 78.0–100.0)
Platelets: 195 10*3/uL (ref 150–400)
RBC: 4.73 MIL/uL (ref 3.87–5.11)
RDW: 13.1 % (ref 11.5–15.5)
WBC: 7.2 10*3/uL (ref 4.0–10.5)

## 2014-02-24 LAB — T4, FREE: Free T4: 1.05 ng/dL (ref 0.80–1.80)

## 2014-02-24 LAB — MAGNESIUM: Magnesium: 1.8 mg/dL (ref 1.5–2.5)

## 2014-02-24 MED ORDER — SODIUM CHLORIDE 0.9 % IV BOLUS (SEPSIS)
1000.0000 mL | Freq: Once | INTRAVENOUS | Status: AC
  Administered 2014-02-24: 1000 mL via INTRAVENOUS

## 2014-02-24 MED ORDER — IOHEXOL 350 MG/ML SOLN
100.0000 mL | Freq: Once | INTRAVENOUS | Status: AC | PRN
  Administered 2014-02-24: 100 mL via INTRAVENOUS

## 2014-02-24 NOTE — ED Notes (Signed)
Pt back from CT

## 2014-02-24 NOTE — ED Provider Notes (Signed)
CSN: [id number]     Arrival date & time 02/24/14  1433 History   None    Chief Complaint  Patient presents with  . Fatigue     (Consider location/radiation/quality/duration/timing/severity/associated sxs/prior Treatment) HPI [name] 79 y.o. with a PMH of HTN, DM, schizophrenia, PE (on xarelto) presents from the ALF for concern of generalized fatigue and shortness of breath. This started in the past 2-3 days and in the past 3 days she was noted to have an irregular heart rate. She also reports palpitations. She reports generalized fatigue which is worsened. This is worsened with exertion. No known relieving factors. This is associated with exertional SOB that is improved with rest. This is moderate in severity. Denies any chest pain, cough, congestion, fever, chills, sweats, N/V/D, abdominal pain. No dysuria, hematuria, urinary frequency. Denies any alcohol or drug use. Reports compliance with her medications.   Past Medical History  Diagnosis Date  . Hypertension   . Diabetes mellitus   . Schizophrenia   . Hyperlipemia   . PE (pulmonary embolism)   . Pulmonary infarction   . Diastolic heart failure   . Urinary retention    Past Surgical History  Procedure Laterality Date  . Cholecystectomy    . Abdominal hysterectomy     Family History  Problem Relation Age of Onset  . Heart disease Mother   . Kidney disease Father    History  Substance Use Topics  . Smoking status: Never Smoker   . Smokeless tobacco: Never Used  . Alcohol Use: No   OB History   Grav Para Term Preterm Abortions TAB SAB Ect Mult Living                 Review of Systems  All other systems reviewed and are negative.     Allergies  Review of patient's allergies indicates no known allergies.  Home Medications   Prior to Admission medications   Medication Sig Start Date End Date Taking? Authorizing Provider  amantadine (SYMMETREL) 100 MG capsule Take 100 mg by mouth every morning.    Yes  Historical Provider, MD  atorvastatin (LIPITOR) 20 MG tablet Take 20 mg by mouth daily.    Yes Historical Provider, MD  calcium-vitamin D (OSCAL 500/200 D-3) 500-200 MG-UNIT per tablet Take 1 tablet by mouth daily.    Yes Historical Provider, MD  carvedilol (COREG) 3.125 MG tablet Take 1 tablet (3.125 mg total) by mouth 2 (two) times daily with a meal. 10/08/12  Yes Catarina Hartshorn, MD  fish oil-omega-3 fatty acids 1000 MG capsule Take 1 g by mouth 2 (two) times daily.    Yes Historical Provider, MD  furosemide (LASIX) 20 MG tablet Take 20 mg by mouth every other day. Takes on even days   Yes Historical Provider, MD  lisinopril (PRINIVIL,ZESTRIL) 10 MG tablet Take 15 mg by mouth every morning.    Yes Historical Provider, MD  mirtazapine (REMERON) 30 MG tablet Take 30 mg by mouth daily with breakfast.    Yes Historical Provider, MD  oxymetazoline (AFRIN NASAL SPRAY) 0.05 % nasal spray Place 2 sprays into both nostrils as needed (for nose bleed). 10/21/13  Yes Vivi Barrack, MD  perphenazine (TRILAFON) 16 MG tablet Take 16 mg by mouth at bedtime.   Yes Historical Provider, MD  rivaroxaban (XARELTO) 20 MG TABS tablet Take 20 mg by mouth at bedtime.    Historical Provider, MD   BP 146/92  Temp(Src) 98.2 F (36.8 C) (Oral)  Resp 16  Ht  (1.651 m)  Wt 210 lb (95.255 kg)  BMI 34.95 kg/m2  SpO2 89% Physical Exam  Constitutional: She is oriented to person, place, and time. She appears well-developed and well-nourished. No distress.  HENT:  Head: Normocephalic and atraumatic.  Right Ear: External ear normal.  Left Ear: External ear normal.  Eyes: Conjunctivae and EOM are normal. Right eye exhibits no discharge. Left eye exhibits no discharge.  Neck: Normal range of motion. Neck supple. No JVD present.  Cardiovascular: Normal rate and normal heart sounds.  An irregularly irregular rhythm present. Exam reveals no gallop and no friction rub.   No murmur heard. Pulses:      Radial pulses are 2+ on the  right side, and 2+ on the left side.       Dorsalis pedis pulses are 2+ on the right side, and 2+ on the left side.  Pulmonary/Chest: Effort normal and breath sounds normal. No stridor. No respiratory distress. She has no wheezes. She has no rales. She exhibits no tenderness.  Abdominal: Soft. Bowel sounds are normal. She exhibits no distension. There is no tenderness. There is no rebound and no guarding.  Musculoskeletal: Normal range of motion. She exhibits no edema.  Neurological: She is alert and oriented to person, place, and time.  Skin: Skin is warm. No rash noted. She is not diaphoretic.  Psychiatric: She has a normal mood and affect. Her behavior is normal.    ED Course  Procedures (including critical care time) Labs Review Labs Reviewed  CBC - Abnormal; Notable for the following:    Hemoglobin 15.3 (*)    All other components within normal limits  BASIC METABOLIC PANEL - Abnormal; Notable for the following:    Glucose, Bld 106 (*)    GFR calc non Af Amer 80 (*)    All other components within normal limits  URINALYSIS, ROUTINE W REFLEX MICROSCOPIC  TSH  T4, FREE  MAGNESIUM  DIFFERENTIAL  I-STAT TROPOININ, ED    Imaging Review Dg Chest Port 1 View  02/24/2014   CLINICAL DATA:  Short of breath for 2 months to 3 months.  EXAM: PORTABLE CHEST - 1 VIEW  COMPARISON:  10/04/2012.  10/02/2013.  FINDINGS: Cardiopericardial silhouette within normal limits for projection. Tortuous thoracic aorta. Monitoring leads project over the chest. There is no airspace disease. Streaky RIGHT basilar opacity is present, most compatible with atelectasis.  IMPRESSION: Pulmonary vascular congestion. Streaky RIGHT basilar opacity most compatible with atelectasis.   Electronically Signed   By: Andreas Newport M.D.   On: 02/24/2014 15:28     EKG Interpretation   Date/Time:  Tuesday February 24 2014 14:44:32 EDT Ventricular Rate:  78 PR Interval:  165 QRS Duration: 91 QT Interval:  380 QTC  Calculation: 433 R Axis:   -73 Text Interpretation:  Sinus rhythm Left anterior fascicular block Left  ventricular hypertrophy Anterior infarct, old ST elevation, consider  inferior injury No acute changes Confirmed by Rhunette Croft, MD, Janey Genta [phone])  on 02/24/2014 3:33:08 PM      MDM   Final diagnoses:  None    Pt presents with fatigue, SOB and palpitations. Sinus arrhythmia noted on ECG. But no evidence of afib or a flutter. No resp distress. BSCA bilaterally. AFVSS. HDS. CXR neg for definitive focal consolidation. NO PTX. Given history of PE and new O2 requirment, will need CTA for PE. Trop neg. No ECG changes suggestive of acute ischemia. CT neg for PE. UA c/w dehydration, but no UTI. Ambulated  without any supplemental oxygen and O2 sats remained >90%. O2 removed. Symptoms likely related to dehydration. Gave precautions for PO rehydration education. WIll need to fu with cardiology for her sinus arrhythmia given she is intermittently symptomatic. No indication to start antiarrhythmics at this time. Safe and stable to dc home. I answered all of her questions and she was in agreement with the treatment plan. Strong return precautions given for worsening symptoms or any other alarming or concerning symptoms or issues. The patient was in agreement with the treatment plan and I answered all of their questions. The patient was stable for dc. At dc, the patient ambulated without difficulty, was moving all four extremities, symptoms improved, NAD. and AOx4. If performed and available, imaging studies and labs reviewed.   Care discussed with my attending, Dr. Vivia Ewing.     Sena Hitch, MD 02/24/14 2256

## 2014-02-24 NOTE — ED Notes (Signed)
Per EMS - pt coming from [facility] on [address]. Staff reports irregular HR x 3 days. 12 lead showing PACs. BP 146/94, HR 72, RR 18, CBG 96, 94% on room air. Pt only complaint is fatigue. Nad, skin warm and dry, resp e/u. EMS inserted 20G in left AC.

## 2014-02-24 NOTE — ED Notes (Signed)
Pt to be sent home via [facility].

## 2014-02-24 NOTE — Discharge Instructions (Signed)

## 2014-02-24 NOTE — ED Notes (Signed)
Pt to CT at this time.

## 2014-02-27 ENCOUNTER — Telehealth (HOSPITAL_BASED_OUTPATIENT_CLINIC_OR_DEPARTMENT_OTHER): Payer: Self-pay | Admitting: Emergency Medicine

## 2014-02-27 NOTE — ED Provider Notes (Signed)
I saw and evaluated the patient, reviewed the resident's note and I agree with the findings and plan.   EKG Interpretation   Date/Time:  Tuesday February 24 2014 14:44:32 EDT Ventricular Rate:  78 PR Interval:  165 QRS Duration: 91 QT Interval:  380 QTC Calculation: 433 R Axis:   -73 Text Interpretation:  Sinus rhythm Left anterior fascicular block Left  ventricular hypertrophy Anterior infarct, old ST elevation, consider  inferior injury No acute changes Confirmed by Rhunette Croft, MD, Janey Genta [phone])  on 02/24/2014 3:33:08 PM       Pt comes in with cc of generalized weakness, and dib. Pt was noted to be hypoxic initially. Plan was to r/o PE, ACS, CHF and PNA and admit for O2 req. However, pt was reassessed by the resident, and she was no longer hypoxic, and so i advised ambulatory pulsox, which show no hypoxia, and thus patient was discharged with stricl return precautions. On my exam, there was no resp distress. Needs close PCP f/u.  Derwood Kaplan, MD 02/27/14 0020

## 2014-03-06 ENCOUNTER — Emergency Department (HOSPITAL_COMMUNITY): Payer: Medicare Other

## 2014-03-06 ENCOUNTER — Inpatient Hospital Stay (HOSPITAL_COMMUNITY)
Admission: EM | Admit: 2014-03-06 | Discharge: 2014-03-19 | DRG: 064 | Disposition: E | Payer: Medicare Other | Source: Skilled Nursing Facility | Attending: Pulmonary Disease | Admitting: Pulmonary Disease

## 2014-03-06 ENCOUNTER — Encounter (HOSPITAL_COMMUNITY): Payer: Self-pay | Admitting: Emergency Medicine

## 2014-03-06 DIAGNOSIS — F209 Schizophrenia, unspecified: Secondary | ICD-10-CM | POA: Diagnosis present

## 2014-03-06 DIAGNOSIS — Z515 Encounter for palliative care: Secondary | ICD-10-CM | POA: Diagnosis not present

## 2014-03-06 DIAGNOSIS — I509 Heart failure, unspecified: Secondary | ICD-10-CM | POA: Diagnosis present

## 2014-03-06 DIAGNOSIS — J9600 Acute respiratory failure, unspecified whether with hypoxia or hypercapnia: Secondary | ICD-10-CM | POA: Diagnosis present

## 2014-03-06 DIAGNOSIS — E785 Hyperlipidemia, unspecified: Secondary | ICD-10-CM | POA: Diagnosis present

## 2014-03-06 DIAGNOSIS — Z79899 Other long term (current) drug therapy: Secondary | ICD-10-CM | POA: Diagnosis not present

## 2014-03-06 DIAGNOSIS — Z86711 Personal history of pulmonary embolism: Secondary | ICD-10-CM | POA: Diagnosis not present

## 2014-03-06 DIAGNOSIS — R4182 Altered mental status, unspecified: Secondary | ICD-10-CM | POA: Diagnosis present

## 2014-03-06 DIAGNOSIS — R4020 Unspecified coma: Secondary | ICD-10-CM | POA: Diagnosis present

## 2014-03-06 DIAGNOSIS — E119 Type 2 diabetes mellitus without complications: Secondary | ICD-10-CM | POA: Diagnosis present

## 2014-03-06 DIAGNOSIS — Z66 Do not resuscitate: Secondary | ICD-10-CM | POA: Diagnosis present

## 2014-03-06 DIAGNOSIS — I5032 Chronic diastolic (congestive) heart failure: Secondary | ICD-10-CM | POA: Diagnosis present

## 2014-03-06 DIAGNOSIS — I619 Nontraumatic intracerebral hemorrhage, unspecified: Secondary | ICD-10-CM | POA: Diagnosis present

## 2014-03-06 DIAGNOSIS — J9819 Other pulmonary collapse: Secondary | ICD-10-CM | POA: Diagnosis present

## 2014-03-06 DIAGNOSIS — I629 Nontraumatic intracranial hemorrhage, unspecified: Secondary | ICD-10-CM | POA: Diagnosis present

## 2014-03-06 DIAGNOSIS — I10 Essential (primary) hypertension: Secondary | ICD-10-CM | POA: Diagnosis present

## 2014-03-06 DIAGNOSIS — Z7901 Long term (current) use of anticoagulants: Secondary | ICD-10-CM

## 2014-03-06 DIAGNOSIS — J9601 Acute respiratory failure with hypoxia: Secondary | ICD-10-CM

## 2014-03-06 LAB — URINALYSIS, ROUTINE W REFLEX MICROSCOPIC
Bilirubin Urine: NEGATIVE
Glucose, UA: NEGATIVE mg/dL
Hgb urine dipstick: NEGATIVE
Ketones, ur: NEGATIVE mg/dL
Leukocytes, UA: NEGATIVE
Nitrite: NEGATIVE
Protein, ur: 100 mg/dL — AB
Specific Gravity, Urine: 1.013 (ref 1.005–1.030)
Urobilinogen, UA: 0.2 mg/dL (ref 0.0–1.0)
pH: 7 (ref 5.0–8.0)

## 2014-03-06 LAB — COMPREHENSIVE METABOLIC PANEL
ALBUMIN: 3.8 g/dL (ref 3.5–5.2)
ALT: 17 U/L (ref 0–35)
ANION GAP: 11 (ref 5–15)
AST: 22 U/L (ref 0–37)
Alkaline Phosphatase: 86 U/L (ref 39–117)
BILIRUBIN TOTAL: 0.5 mg/dL (ref 0.3–1.2)
BUN: 13 mg/dL (ref 6–23)
CO2: 29 mEq/L (ref 19–32)
CREATININE: 0.64 mg/dL (ref 0.50–1.10)
Calcium: 9.2 mg/dL (ref 8.4–10.5)
Chloride: 99 mEq/L (ref 96–112)
GFR calc non Af Amer: 85 mL/min — ABNORMAL LOW (ref >90)
Glucose, Bld: 188 mg/dL — ABNORMAL HIGH (ref 70–99)
Potassium: 4.7 mEq/L (ref 3.7–5.3)
Sodium: 139 mEq/L (ref 137–147)
Total Protein: 7.1 g/dL (ref 6.0–8.3)

## 2014-03-06 LAB — RAPID URINE DRUG SCREEN, HOSP PERFORMED
Amphetamines: NOT DETECTED
Barbiturates: NOT DETECTED
Benzodiazepines: NOT DETECTED
Cocaine: NOT DETECTED
OPIATES: NOT DETECTED
Tetrahydrocannabinol: NOT DETECTED

## 2014-03-06 LAB — I-STAT ARTERIAL BLOOD GAS, ED
Acid-Base Excess: 4 mmol/L — ABNORMAL HIGH (ref 0.0–2.0)
Bicarbonate: 30.6 mEq/L — ABNORMAL HIGH (ref 20.0–24.0)
O2 SAT: 98 %
PO2 ART: 111 mmHg — AB (ref 80.0–100.0)
TCO2: 32 mmol/L (ref 0–100)
pCO2 arterial: 49.1 mmHg — ABNORMAL HIGH (ref 35.0–45.0)
pH, Arterial: 7.397 (ref 7.350–7.450)

## 2014-03-06 LAB — CBC WITH DIFFERENTIAL/PLATELET
BASOS PCT: 0 % (ref 0–1)
Basophils Absolute: 0 10*3/uL (ref 0.0–0.1)
Eosinophils Absolute: 0 10*3/uL (ref 0.0–0.7)
Eosinophils Relative: 0 % (ref 0–5)
HEMATOCRIT: 48.1 % — AB (ref 36.0–46.0)
HEMOGLOBIN: 16.2 g/dL — AB (ref 12.0–15.0)
Lymphocytes Relative: 16 % (ref 12–46)
Lymphs Abs: 1.8 10*3/uL (ref 0.7–4.0)
MCH: 32 pg (ref 26.0–34.0)
MCHC: 33.7 g/dL (ref 30.0–36.0)
MCV: 94.9 fL (ref 78.0–100.0)
MONO ABS: 0.2 10*3/uL (ref 0.1–1.0)
MONOS PCT: 2 % — AB (ref 3–12)
NEUTROS ABS: 9.1 10*3/uL — AB (ref 1.7–7.7)
Neutrophils Relative %: 82 % — ABNORMAL HIGH (ref 43–77)
Platelets: 196 10*3/uL (ref 150–400)
RBC: 5.07 MIL/uL (ref 3.87–5.11)
RDW: 13.1 % (ref 11.5–15.5)
WBC: 11.1 10*3/uL — ABNORMAL HIGH (ref 4.0–10.5)

## 2014-03-06 LAB — I-STAT CG4 LACTIC ACID, ED: Lactic Acid, Venous: 1.49 mmol/L (ref 0.5–2.2)

## 2014-03-06 LAB — I-STAT TROPONIN, ED: Troponin i, poc: 0 ng/mL (ref 0.00–0.08)

## 2014-03-06 LAB — I-STAT CHEM 8, ED
BUN: 14 mg/dL (ref 6–23)
Calcium, Ion: 1.05 mmol/L — ABNORMAL LOW (ref 1.13–1.30)
Chloride: 103 mEq/L (ref 96–112)
Creatinine, Ser: 0.7 mg/dL (ref 0.50–1.10)
Glucose, Bld: 190 mg/dL — ABNORMAL HIGH (ref 70–99)
HCT: 53 % — ABNORMAL HIGH (ref 36.0–46.0)
Hemoglobin: 18 g/dL — ABNORMAL HIGH (ref 12.0–15.0)
Potassium: 4.4 mEq/L (ref 3.7–5.3)
Sodium: 136 mEq/L — ABNORMAL LOW (ref 137–147)
TCO2: 30 mmol/L (ref 0–100)

## 2014-03-06 LAB — URINE MICROSCOPIC-ADD ON

## 2014-03-06 LAB — ETHANOL: Alcohol, Ethyl (B): <11 mg/dL (ref 0–11)

## 2014-03-06 MED ORDER — PROPOFOL 10 MG/ML IV EMUL
INTRAVENOUS | Status: AC
  Filled 2014-03-06: qty 100

## 2014-03-06 MED ORDER — MORPHINE SULFATE 10 MG/ML IJ SOLN
1.0000 mg/h | INTRAVENOUS | Status: DC
  Administered 2014-03-06: 1 mg/h via INTRAVENOUS
  Filled 2014-03-06: qty 10

## 2014-03-06 MED ORDER — ATROPINE SULFATE 1 % OP SOLN: 2.0000 [drp] | Freq: Four times a day (QID) | OPHTHALMIC | Status: DC | PRN

## 2014-03-06 MED ORDER — ROCURONIUM BROMIDE 50 MG/5ML IV SOLN
INTRAVENOUS | Status: AC
  Filled 2014-03-06: qty 2

## 2014-03-06 MED ORDER — PROPOFOL 10 MG/ML IV EMUL
5.0000 ug/kg/min | INTRAVENOUS | Status: DC
  Administered 2014-03-06: 10 ug/kg/min via INTRAVENOUS

## 2014-03-06 MED ORDER — SODIUM CHLORIDE 0.9 % IV BOLUS (SEPSIS)
1000.0000 mL | Freq: Once | INTRAVENOUS | Status: AC
  Administered 2014-03-06: 1000 mL via INTRAVENOUS

## 2014-03-06 MED ORDER — SODIUM CHLORIDE 0.9 % IV SOLN: INTRAVENOUS | Status: DC

## 2014-03-06 MED ORDER — SUCCINYLCHOLINE CHLORIDE 20 MG/ML IJ SOLN
INTRAMUSCULAR | Status: AC
  Filled 2014-03-06: qty 1

## 2014-03-06 MED ORDER — ETOMIDATE 2 MG/ML IV SOLN
INTRAVENOUS | Status: AC
  Filled 2014-03-06: qty 20

## 2014-03-06 MED ORDER — LIDOCAINE HCL (CARDIAC) 20 MG/ML IV SOLN
INTRAVENOUS | Status: AC
  Filled 2014-03-06: qty 5

## 2014-03-06 MED ORDER — ETOMIDATE 2 MG/ML IV SOLN
INTRAVENOUS | Status: DC | PRN
  Administered 2014-03-06: 20 mg via INTRAVENOUS

## 2014-03-06 MED ORDER — SUCCINYLCHOLINE CHLORIDE 20 MG/ML IJ SOLN
INTRAMUSCULAR | Status: DC | PRN
  Administered 2014-03-06: 100 mg via INTRAVENOUS

## 2014-03-06 MED ORDER — LORAZEPAM 1 MG PO TABS: 1.0000 mg | ORAL_TABLET | ORAL | Status: DC | PRN

## 2014-03-06 MED ORDER — MORPHINE BOLUS VIA INFUSION
4.0000 mg | INTRAVENOUS | Status: DC | PRN
  Filled 2014-03-06: qty 20

## 2014-03-19 NOTE — ED Notes (Signed)
Dr. Jeraldine Loots positioning patient, patient being intubated using video laryngoscopy.  Patient being suctioned.  Green secretions present on video,

## 2014-03-19 NOTE — ED Notes (Signed)
Critical care at bedside  

## 2014-03-19 NOTE — ED Notes (Signed)
Per Dr. Jeraldine Loots: After speaking with family, Dr. Jeraldine Loots states palliative care to be consulted for patient, patient to be extubated and placed on comfort care.  Family aware of plan and are currently at bedside

## 2014-03-19 NOTE — Progress Notes (Signed)
Patient transported to CT back to trauma C room without any complications.

## 2014-03-19 NOTE — ED Provider Notes (Signed)
CSN: [id number]     Arrival date & time 03/08/2014  0804 History   First MD Initiated Contact with Patient 03-08-2014 [phone]     No chief complaint on file.    (Consider location/radiation/quality/duration/timing/severity/associated sxs/prior Treatment) HPI Patient presents from her nursing facility in extremis, unresponsive. Per report the patient was found by a room mate, after a choking episode, unresponsive. Per report the patient had loss of pulses, received CPR, and EMS reports that in route the patient had spontaneous circulation, unremarkable blood pressure, but had no interactivity, requiring placement of oral pharyngeal airway. Level V caveat secondary to acuity of condition. On the initial evaluation the patient is unresponsive, only responding inconsistently to painful stimuli, required intubation emergently for airway protection.  Past Medical History  Diagnosis Date  . Hypertension   . Diabetes mellitus   . Schizophrenia   . Hyperlipemia   . PE (pulmonary embolism)   . Pulmonary infarction   . Diastolic heart failure   . Urinary retention    Past Surgical History  Procedure Laterality Date  . Cholecystectomy    . Abdominal hysterectomy     Family History  Problem Relation Age of Onset  . Heart disease Mother   . Kidney disease Father    History  Substance Use Topics  . Smoking status: Never Smoker   . Smokeless tobacco: Never Used  . Alcohol Use: No   OB History   Grav Para Term Preterm Abortions TAB SAB Ect Mult Living                 Review of Systems  Unable to perform ROS: Intubated      Allergies  Review of patient's allergies indicates no known allergies.  Home Medications   Prior to Admission medications   Medication Sig Start Date End Date Taking? Authorizing Provider  amantadine (SYMMETREL) 100 MG capsule Take 100 mg by mouth every morning.     Historical Provider, MD  atorvastatin (LIPITOR) 20 MG tablet Take 20 mg by mouth daily.      Historical Provider, MD  calcium-vitamin D (OSCAL 500/200 D-3) 500-200 MG-UNIT per tablet Take 1 tablet by mouth daily.     Historical Provider, MD  carvedilol (COREG) 3.125 MG tablet Take 1 tablet (3.125 mg total) by mouth 2 (two) times daily with a meal. 10/08/12   Catarina Hartshorn, MD  fish oil-omega-3 fatty acids 1000 MG capsule Take 1 g by mouth 2 (two) times daily.     Historical Provider, MD  furosemide (LASIX) 20 MG tablet Take 20 mg by mouth every other day. Takes on even days    Historical Provider, MD  lisinopril (PRINIVIL,ZESTRIL) 10 MG tablet Take 15 mg by mouth every morning.     Historical Provider, MD  mirtazapine (REMERON) 30 MG tablet Take 30 mg by mouth daily with breakfast.     Historical Provider, MD  oxymetazoline (AFRIN NASAL SPRAY) 0.05 % nasal spray Place 2 sprays into both nostrils as needed (for nose bleed). 10/21/13   Vivi Barrack, MD  perphenazine (TRILAFON) 16 MG tablet Take 16 mg by mouth at bedtime.    Historical Provider, MD  rivaroxaban (XARELTO) 20 MG TABS tablet Take 20 mg by mouth at bedtime.    Historical Provider, MD   BP 227/122  Pulse 79  Resp 22  SpO2 100% Physical Exam  Nursing note and vitals reviewed. Constitutional:  Obese elderly female in extremities, unresponsive, responding only occasionally even to painful stimuli  HENT:  Head:  Normocephalic and atraumatic.  Eyes: No scleral icterus.  Pupils were pinpoint, nonresponsive, with no spontaneous motion on arrival.  After intubation, pupils were 5 mm right, 4 mm left, minimally responsive, no tracking, no corneal blink response  Neck: Neck supple.  Cardiovascular: Normal rate, regular rhythm and intact distal pulses.   Pulmonary/Chest:  Appreciable breath sounds symmetric, both with bag valve mask and mechanical ventilation  Abdominal: Soft. She exhibits no distension.  Musculoskeletal:  No gross evidence of trauma  Neurological:  Unresponsive, no spontaneous visual activity, no spontaneous speech,  minimal spontaneous motion of the lower extremities,  Skin:  No obvious skin breakdown  Psychiatric: Cognition and memory are impaired.    ED Course  Procedures (including critical care time) Labs Review Labs Reviewed  CBC WITH DIFFERENTIAL - Abnormal; Notable for the following:    WBC 11.1 (*)    Hemoglobin 16.2 (*)    HCT 48.1 (*)    Neutrophils Relative % 82 (*)    Neutro Abs 9.1 (*)    Monocytes Relative 2 (*)    All other components within normal limits  COMPREHENSIVE METABOLIC PANEL - Abnormal; Notable for the following:    Glucose, Bld 188 (*)    GFR calc non Af Amer 85 (*)    All other components within normal limits  URINALYSIS, ROUTINE W REFLEX MICROSCOPIC - Abnormal; Notable for the following:    Protein, ur 100 (*)    All other components within normal limits  I-STAT CHEM 8, ED - Abnormal; Notable for the following:    Sodium 136 (*)    Glucose, Bld 190 (*)    Calcium, Ion 1.05 (*)    Hemoglobin 18.0 (*)    HCT 53.0 (*)    All other components within normal limits  I-STAT ARTERIAL BLOOD GAS, ED - Abnormal; Notable for the following:    pCO2 arterial 49.1 (*)    pO2, Arterial 111.0 (*)    Bicarbonate 30.6 (*)    Acid-Base Excess 4.0 (*)    All other components within normal limits  URINE RAPID DRUG SCREEN (HOSP PERFORMED)  ETHANOL  URINE MICROSCOPIC-ADD ON  BLOOD GAS, ARTERIAL  I-STAT CG4 LACTIC ACID, ED  I-STAT TROPOININ, ED    Imaging Review Ct Head Wo Contrast  03/20/2014   CLINICAL DATA:  Unresponsive  EXAM: CT HEAD WITHOUT CONTRAST  TECHNIQUE: Contiguous axial images were obtained from the base of the skull through the vertex without intravenous contrast.  COMPARISON:  None.  FINDINGS: There is a large hemorrhagic pontine infarct with a hematoma measuring 2.7 x 3.2 cm. There is extension of intraparenchymal hemorrhage into the medulla. There is a large amount of intraventricular hemorrhage within the fourth ventricle and a small amount of hemorrhage  within the occipital horns of bilateral lateral ventricles. There is a small amount of hemorrhage along the quadrigeminal plate cistern.  Periventricular white matter low attenuation likely secondary to microangiopathy. Generalized cerebral atrophy. There is intracranial cerebrovascular atherosclerotic disease.  There is no evidence of mass effect or midline shift.  The ventricles and sulci are appropriate for the patient's age. The basal cisterns are patent.  Visualized portions of the orbits are unremarkable. The visualized portions of the paranasal sinuses and mastoid air cells are unremarkable.  The osseous structures are unremarkable.  IMPRESSION: 1. Large hemorrhagic infarct involving the pons and extending into the medulla with a large amount of hemorrhage within the fourth ventricle. Small amount of hemorrhage within the occipital horns of bilateral lateral  ventricles. No hydrocephalus at this time.  Critical Value/emergent results were called by telephone at the time of interpretation on 22-Mar-2014 at 8:55 am to Dr. Gerhard Munch , who verbally acknowledged these results.   Electronically Signed   By: Elige Ko   On: March 22, 2014 08:56   Dg Chest Port 1 View  03-22-14   CLINICAL DATA:  Recent stroke, endotracheal tube placement  EXAM: PORTABLE CHEST - 1 VIEW  COMPARISON:  None.  FINDINGS: Endotracheal tube is noted 3 cm above the carina. The cardiac shadow is mildly enlarged. The lungs are well aerated bilaterally. Very minimal atelectasis is noted in the left lung base. No effusion or pneumothorax is seen.  IMPRESSION: Endotracheal tube as described.  Mild left basilar atelectasis.   Electronically Signed   By: Alcide Clever M.D.   On: 2014/03/22 08:37     EKG Interpretation   Date/Time:  Friday Mar 22, 2014 08:08:08 EDT Ventricular Rate:  82 PR Interval:  195 QRS Duration: 81 QT Interval:  375 QTC Calculation: 438 R Axis:   -54 Text Interpretation:  Sinus rhythm RSR' in V1 or V2,  right VCD or RVH  Inferior infarct, old Consider anterior infarct Sinus rhythm Artifact  prominent T waves Abnormal ekg Confirmed by Gerhard Munch  MD [phone]) on  03-22-14 8:25:50 AM     During initial exam it became clear that the patient was not capable of protecting her airway, had minimal neurologic spontaneous activity, required intubation. Prior to admission the patient is hypertensive, 220 /120, with oxygen saturation 99% in the assisted ventilation abnormal Heart rhythm was 75, regular, normal   INTUBATION Performed by: Gerhard Munch  Required items: required blood products, implants, devices, and special equipment available Patient identity confirmed: provided demographic data and hospital-assigned identification number Time out: Immediately prior to procedure a "time out" was called to verify the correct patient, procedure, equipment, support staff and site/side marked as required.  Indications: airway protection, unresponsive  Intubation method: yes Glidescope Laryngoscopy   Preoxygenation: BVM  Sedatives: 20Etomidate Paralytic: 100Succinylcholine  Tube Size: 7.5 cuffed  Post-procedure assessment: chest rise and ETCO2 monitor Breath sounds: equal and absent over the epigastrium Tube secured with: ETT holder Chest x-ray interpreted by radiologist and me.  Chest x-ray findings: endotracheal tube in appropriate position  Patient tolerated the procedure well with no immediate complications.   Chart review demonstrates the patient's multiple medical problems, largely psychiatric.  Update: I discussed the patient's case with neurosurgery, neurology, intensive care. At this patient case with her family. Patient's son is a Marine scientist and he is aware of all findings. On repeat exam the patient is in some condition moving her legs minimally, spontaneously, no other spontaneous motion.  Blood pressure remains elevated.   After a lengthy conversation with the son,  we elected against attempt to reverse the anticoagulant, and the patient will be made comfort care, DO NOT RESUSCITATE.   1201/11/03: After much consultation with critical care, palliative care, the patient had extubation, was started on a morphine drip. The patient died soon thereafter. The patient's son and his companion were at her bedside the  The patient's primary care physician office is aware of her death.  Time of Death 11-03-01  MDM   Patient presents after being found in extremis at her nursing facility, and is found to have intracranial hemorrhage. After initial resuscitation with intubation, emergent CT scan, consultation with neurology, neurosurgery, critical care, and provision of sedation, care maneuvers, and a significant discussion with  patient's family members, patient had care withdrawn, given the extensive nature of her intracranial hemorrhage. Patient died in the emergency department after being made DO NOT RESUSCITATE.  CRITICAL CARE Performed by: Gerhard Munch Total critical care time: 40 Critical care time was exclusive of separately billable procedures and treating other patients. Critical care was necessary to treat or prevent imminent or life-threatening deterioration. Critical care was time spent personally by me on the following activities: development of treatment plan with patient and/or surrogate as well as nursing, discussions with consultants, evaluation of patient's response to treatment, examination of patient, obtaining history from patient or surrogate, ordering and performing treatments and interventions, ordering and review of laboratory studies, ordering and review of radiographic studies, pulse oximetry and re-evaluation of patient's condition.      Gerhard Munch, MD 03/25/14 1233

## 2014-03-19 NOTE — ED Notes (Signed)
Portable chest outside room, waiting to place OG tube before chest xray

## 2014-03-19 NOTE — ED Notes (Signed)
Portable chest done

## 2014-03-19 NOTE — ED Notes (Signed)
Patient moving lower extremities when positioned

## 2014-03-19 NOTE — ED Notes (Signed)
Patient extubated, placed on 4 L o2 nasal cannula.  Dr. Jeraldine Loots at bedside

## 2014-03-19 NOTE — ED Notes (Signed)
Portable chest called again to confirm placement

## 2014-03-19 NOTE — Progress Notes (Addendum)
Patient intubated for airway protection per MD.  Sats currently 100%.  Patient placed on 8cc tidal volume.  Easy intubation.  Bilateral breath sounds.  Positive color change.  CXR in progress to confirm tube placement.  RT will continue to monitor.

## 2014-03-19 NOTE — ED Notes (Addendum)
Dr. Jeraldine Loots at bedside, received critical results from CT, patient has large head bleed.  Dr. Jeraldine Loots to consult to [facility]

## 2014-03-19 NOTE — ED Notes (Signed)
Good visualization of cords, tube placed 23 at the lip.

## 2014-03-19 NOTE — ED Notes (Signed)
Family outside, Dr. Jeraldine Loots informed

## 2014-03-19 NOTE — Procedures (Signed)
Extubation Procedure Note  Patient Details:   Name: [name] DOB: [birth year] MRN: [id number]   Airway Documentation:     Evaluation  O2 sats: stable throughout Complications: No apparent complications Patient did not tolerate procedure well. Bilateral Breath Sounds: Clear Suctioning: Oral;Airway No  Patient made comfort care and extubated, per MD.  Was placed on 4LNC.    Ledell Noss N 03/25/2014, 11:45 AM

## 2014-03-19 NOTE — ED Notes (Signed)
Rapid sequence intubation taking place, Dr. Jeraldine Loots at bedside and RT at bedside as well.

## 2014-03-19 NOTE — ED Notes (Signed)
Patient CG4 labs given to Dr.Lockwood. 

## 2014-03-19 NOTE — ED Notes (Signed)
Dr. Jeraldine Loots has spoken with family and updated them on situation.  Family currently at bedside

## 2014-03-19 NOTE — ED Notes (Signed)
Transported to CT 

## 2014-03-19 NOTE — ED Notes (Signed)
Patient returned from CT

## 2014-03-19 NOTE — ED Notes (Signed)
Patient bolused  of propofol per Dr. Jeraldine Loots orders

## 2014-03-19 NOTE — H&P (Addendum)
Name: [name] MRN: [id number] DOB: 07-04-1934    ADMISSION DATE:  03-23-14 CONSULTATION DATE:  03/23/2014  REFERRING MD :  Dr. Jeraldine Loots PRIMARY SERVICE:  CCM  CHIEF COMPLAINT:  CVA / Hemorrhagic, Acute Respiratory Failure   BRIEF PATIENT DESCRIPTION: 79 y/o F with PMH of PE (09/2012) on Xarelto who presented to [facility] ER on 9/18 with acute AMS & respiratory failure.  Found to have a large hemorrhagic CVA.  PCCM called for admission.    SIGNIFICANT EVENTS: 9/18  Admit with large hemorrhagic CVA.  Family decision to progress toward comfort care.    STUDIES:  9/18  CT Head >> large hemorrhagic infarct involving the pons extending to medulla & fourth ventricle.  Small hemorrhage in occipital horns of bilateral ventricles, no hydrocephalus at this time.   LINES / TUBES: OETT 9/18 >> 9/18   HISTORY OF PRESENT ILLNESS:  79 y/o F with PMH of HTN, DM, Schizophrenia, HLD, Diastolic CHF, Urinary Retention and  PE (09/2012) on Xarelto who presented to [facility] ER on 9/18 from a SNF after being noted to have an acute change in mental status with difficulty breathing.  EMS activated and pt found to be unresponsive & hypertensive (174/99).  She was transported to the ER where she was immediately intubated on arrival.  CT of the head evaluated which demonstrated a large hemorrhagic infarct involving the pons extending to medulla & fourth ventricle.  Small hemorrhage in occipital horns of bilateral ventricles, & no hydrocephalus at this time.  Family arrived and after update requested to make patient full comfort care.    PCCM called for admission.    PAST MEDICAL HISTORY :  Past Medical History  Diagnosis Date  . Hypertension   . Diabetes mellitus   . Schizophrenia   . Hyperlipemia   . PE (pulmonary embolism)   . Pulmonary infarction   . Diastolic heart failure   . Urinary retention    Past Surgical History  Procedure Laterality Date  . Cholecystectomy    . Abdominal hysterectomy     Prior to  Admission medications   Medication Sig Start Date End Date Taking? Authorizing Provider  amantadine (SYMMETREL) 100 MG capsule Take 100 mg by mouth every morning.     Historical Provider, MD  atorvastatin (LIPITOR) 20 MG tablet Take 20 mg by mouth daily.     Historical Provider, MD  calcium-vitamin D (OSCAL 500/200 D-3) 500-200 MG-UNIT per tablet Take 1 tablet by mouth daily.     Historical Provider, MD  carvedilol (COREG) 3.125 MG tablet Take 1 tablet (3.125 mg total) by mouth 2 (two) times daily with a meal. 10/08/12   Catarina Hartshorn, MD  fish oil-omega-3 fatty acids 1000 MG capsule Take 1 g by mouth 2 (two) times daily.     Historical Provider, MD  furosemide (LASIX) 20 MG tablet Take 20 mg by mouth every other day. Takes on even days    Historical Provider, MD  lisinopril (PRINIVIL,ZESTRIL) 10 MG tablet Take 15 mg by mouth every morning.     Historical Provider, MD  mirtazapine (REMERON) 30 MG tablet Take 30 mg by mouth daily with breakfast.     Historical Provider, MD  oxymetazoline (AFRIN NASAL SPRAY) 0.05 % nasal spray Place 2 sprays into both nostrils as needed (for nose bleed). 10/21/13   Vivi Barrack, MD  perphenazine (TRILAFON) 16 MG tablet Take 16 mg by mouth at bedtime.    Historical Provider, MD  rivaroxaban (XARELTO) 20 MG TABS tablet  Take 20 mg by mouth at bedtime.    Historical Provider, MD   No Known Allergies  FAMILY HISTORY:  Family History  Problem Relation Age of Onset  . Heart disease Mother   . Kidney disease Father    SOCIAL HISTORY:  reports that she has never smoked. She has never used smokeless tobacco. She reports that she does not drink alcohol or use illicit drugs.  REVIEW OF SYSTEMS:  Unable to complete as patient is altered on vent.   SUBJECTIVE: ER staff report patient remains on ventilator, family at bedside and indicate they want to proceed with comfort care  VITAL SIGNS: Temp:  [96 F (35.6 C)-98.4 F (36.9 C)] 98.4 F (36.9 C) (09/18 1015) Pulse Rate:   [48-97] 82 (09/18 1015) Resp:  [13-26] 15 (09/18 1015) BP: (147-227)/(102-147) 147/112 mmHg (09/18 1015) SpO2:  [94 %-100 %] 96 % (09/18 1015) FiO2 (%):  [100 %] 100 % (09/18 0849) Weight:  [210 lb (95.255 kg)] 210 lb (95.255 kg) (09/18 0827)  PHYSICAL EXAMINATION: General:  Elderly female in NAD on vent Neuro:  Unresponsive, decerebrate posturing, pupils dilated / fixed, no corneal response, no gag HEENT:  OETT, mm pink/moist, no jvd Cardiovascular:  s1s2 rrr, no m/r/g Lungs:  resp's even/non-labored, lungs bilaterally clear  Abdomen:  Round/soft, bsx4 active Musculoskeletal:  No acute deformities  Skin:  Warm/dry, no edema    Recent Labs Lab 03/31/2014 0820 03/31/2014 0826  NA 139 136*  K 4.7 4.4  CL 99 103  CO2 29  --   BUN 13 14  CREATININE 0.64 0.70  GLUCOSE 188* 190*    Recent Labs Lab 2014-03-31 0820 31-Mar-2014 0826  HGB 16.2* 18.0*  HCT 48.1* 53.0*  WBC 11.1*  --   PLT 196  --    Ct Head Wo Contrast  03/31/14   CLINICAL DATA:  Unresponsive  EXAM: CT HEAD WITHOUT CONTRAST  TECHNIQUE: Contiguous axial images were obtained from the base of the skull through the vertex without intravenous contrast.  COMPARISON:  None.  FINDINGS: There is a large hemorrhagic pontine infarct with a hematoma measuring 2.7 x 3.2 cm. There is extension of intraparenchymal hemorrhage into the medulla. There is a large amount of intraventricular hemorrhage within the fourth ventricle and a small amount of hemorrhage within the occipital horns of bilateral lateral ventricles. There is a small amount of hemorrhage along the quadrigeminal plate cistern.  Periventricular white matter low attenuation likely secondary to microangiopathy. Generalized cerebral atrophy. There is intracranial cerebrovascular atherosclerotic disease.  There is no evidence of mass effect or midline shift.  The ventricles and sulci are appropriate for the patient's age. The basal cisterns are patent.  Visualized portions of the  orbits are unremarkable. The visualized portions of the paranasal sinuses and mastoid air cells are unremarkable.  The osseous structures are unremarkable.  IMPRESSION: 1. Large hemorrhagic infarct involving the pons and extending into the medulla with a large amount of hemorrhage within the fourth ventricle. Small amount of hemorrhage within the occipital horns of bilateral lateral ventricles. No hydrocephalus at this time.  Critical Value/emergent results were called by telephone at the time of interpretation on 31-Mar-2014 at 8:55 am to Dr. Gerhard Munch , who verbally acknowledged these results.   Electronically Signed   By: Elige Ko   On: 03/31/14 08:56   Dg Chest Port 1 View  03/31/2014   CLINICAL DATA:  Recent stroke, endotracheal tube placement  EXAM: PORTABLE CHEST - 1 VIEW  COMPARISON:  None.  FINDINGS: Endotracheal tube is noted 3 cm above the carina. The cardiac shadow is mildly enlarged. The lungs are well aerated bilaterally. Very minimal atelectasis is noted in the left lung base. No effusion or pneumothorax is seen.  IMPRESSION: Endotracheal tube as described.  Mild left basilar atelectasis.   Electronically Signed   By: Alcide Clever M.D.   On: 03/31/2014 08:37    ASSESSMENT / PLAN:   Hemorrhagic CVA causing coma - large bleed, poor prognosis.  Not a surgical candidate Acute Respiratory Failure - in the setting of CVA L Basilar ATX - noted on cxr DM HTN HLD Schizophrenia   Plan: -admit to ICU, if extubated can downgrade bed to Palliative Care unit -morphine gtt for comfort -d/c propofol once morphine gtt initiated -NS @ [facility] -Palliative Care consult, appreciate input -PRN atropine gtt's for secretions -PRN ativan for anxiety / sedation   Canary Brim, NP-C [facility] Pgr: [phone] or [phone]  Discussed in detail with her son, Dr Deanne Coffer. Given catastrophic neurologic injury & poor prognosis, decision made to withdraw life support. Orders  written, Change to morphine gtt & extubate to comfort care.  Oretha Milch MD  Mar 31, 2014, 10:33 AM

## 2014-03-19 NOTE — ED Notes (Signed)
Positive color change, good bilateral breath sounds

## 2014-03-19 NOTE — ED Notes (Signed)
Patient was with room mate at facility, noted to be choking turning blue, became unresponsive, facility started compressions, upon EMS arrival pulses present, BP with EMS 174/99, patient unresponsive, NRB and intermittent bag valve mask en route, gag reflex absent on presentation to ED.  OPA in place.  No known recent trauma, but is on anticoagulants.  Baseline psych history but unknown baseline mental status, unknown if family en route.   Full code   97-98% NRB  CHF HTN, DMI (198), schizophrenia, PE, MI, DM, on anticoags

## 2014-03-19 NOTE — ED Notes (Signed)
Patient being preoxygenated

## 2014-03-19 NOTE — ED Notes (Signed)
Patient transferred to morgue  

## 2014-03-19 NOTE — ED Notes (Addendum)
Chaplain at bedside

## 2014-03-19 NOTE — Progress Notes (Signed)
Chaplain responded to referral from RN that pt was not doing well and that son was an MD.  Bonney Roussel met with pt, son and significant other in room and offered emotional support as well as hospitality.  Chaplain continued to check in with family throughout pt's death.  03/13/14 1030  Clinical Encounter Type  Visited With Patient and family together;Health care provider  Visit Type Initial;Critical Care;Death;ED;Patient actively dying;Trauma  Referral From Nurse  Spiritual Encounters  Spiritual Needs Emotional;Grief support  Stress Factors  Patient Stress Factors Exhausted;Health changes;Major life changes  Family Stress Factors Exhausted;Family relationships;Health changes;Loss;Major life changes  Advance Directives (For Healthcare)  Does patient have an advance directive? Yes   Geralyn Flash

## 2014-03-19 DEATH — deceased

## 2014-04-02 NOTE — Discharge Summary (Signed)
   Name: [name] MRN: [id number] DOB: 26-Oct-1934    ADMISSION DATE:  02-Jun-2014 CONSULTATION DATE:  Feb 01, 2014  REFERRING MD :  Dr. Jeraldine LootsLockwood PRIMARY SERVICE:  CCM  CHIEF COMPLAINT:  CVA / Hemorrhagic, Acute Respiratory Failure   BRIEF PATIENT DESCRIPTION: 79 y/o F with PMH of PE (09/2012) on Xarelto who presented [facility] ER on 9/18 with acute AMS & respiratory failure.  Found to have a large hemorrhagic CVA.  PCCM called for admission.    SIGNIFICANT EVENTS: 9/18  Admit with large hemorrhagic CVA.  Family decision to progress toward comfort care.    STUDIES:  9/18  CT Head >> large hemorrhagic infarct involving the pons extending to medulla & fourth ventricle.  Small hemorrhage in occipital horns of bilateral ventricles, no hydrocephalus at this time.   LINES / TUBES: OETT 9/18 >> 9/18   HISTORY OF PRESENT ILLNESS:  79 y/o F with PMH of HTN, DM, Schizophrenia, HLD, Diastolic CHF, Urinary Retention and  PE (09/2012) on Xarelto who [facility] from a SNF after being noted to have an acute change in mental status with difficulty breathing.  EMS activated and pt found to be unresponsive & hypertensive (174/99).  She was transported to the ER where she was immediately intubated on arrival.  CT of the head evaluated which demonstrated a large hemorrhagic infarct involving the pons extending to medulla & fourth ventricle.  Small hemorrhage in occipital horns of bilateral ventricles, & no hydrocephalus at this time.  Family arrived and after update requested to make patient full comfort care.    PCCM called for admission.    ASSESSMENT / PLAN:   Hemorrhagic CVA - large bleed, poor prognosis.  Not a surgical candidate Acute Respiratory Failure - in the setting of CVA L Basilar ATX - noted on cxr DM HTN HLD Schizophrenia   Plan: unit -morphine gtt for comfort -d/c propofol once morphine gtt initiated -PRN atropine gtt's for secretions   Course  -  Discussed in detail  with her son, Dr Deanne CofferHassell. Given catastrophic neurologic injury & poor prognosis, decision made to withdraw life support. Changed to morphine gtt & extubated to comfort care.  Cause of death - Intracerebral hemorrhage   Oretha MilchALVA,Chaden Doom V. MD  04/02/2014, 4:22 PM

## 2014-09-12 IMAGING — CR DG CHEST 2V
2 series · 2 of 2 positions shown · non-contrast
Comparison: Two-view chest 08/04/2012.

CLINICAL DATA: Fall.  Shortness of breath.

CHEST - 2 VIEW

[x chest ap]
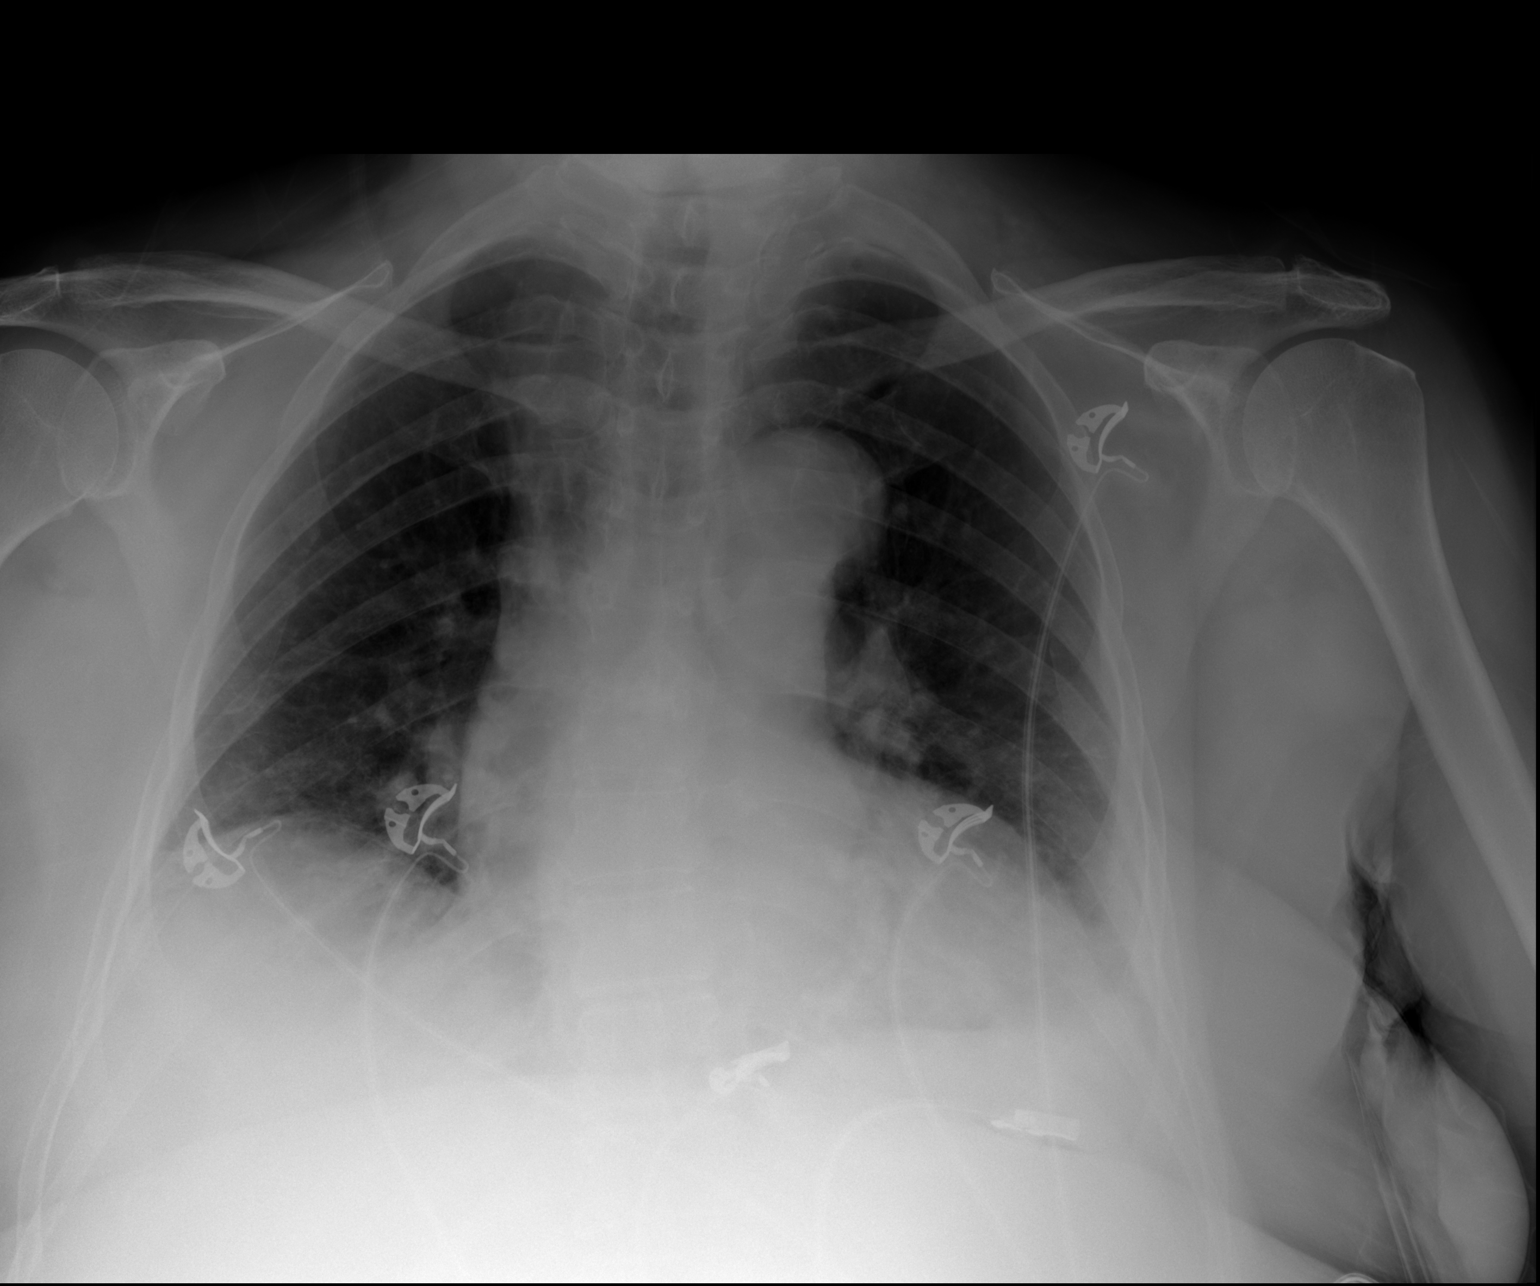

[w chest lat]
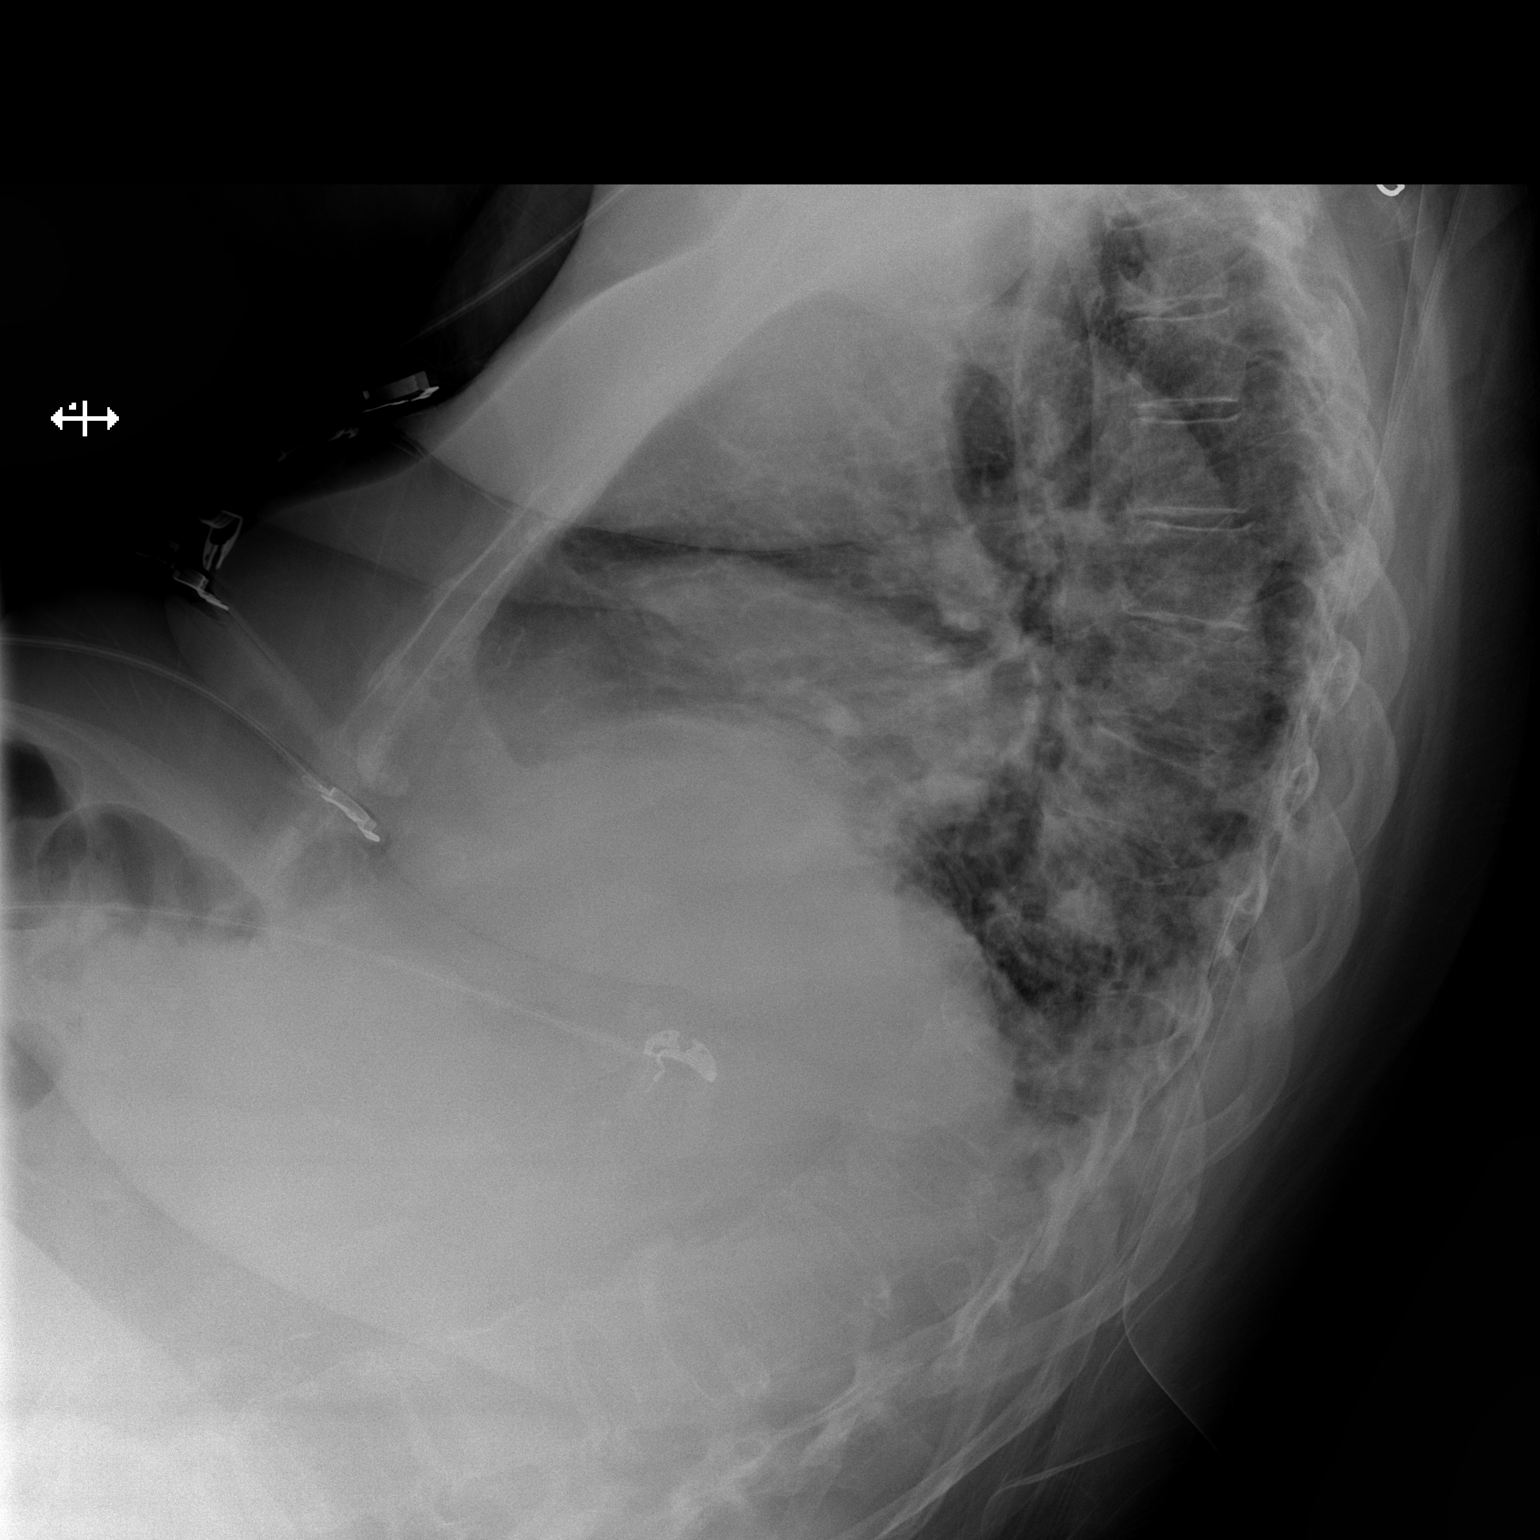

[2 of 2 positions shown; findings below may reference images not displayed]

FINDINGS: Cardiac enlargement is stable. Mild pulmonary vascular
congestion is improved.  There is no frank edema.  There is
blunting of the costophrenic sulci on the lateral view.  Small
bilateral pleural effusions are suspected. No significant airspace
consolidation is evident.
IMPRESSION: 1.  Stable cardiac enlargement.
2.  Decreased pulmonary vascular congestion and small bilateral
pleural effusions.  This may still represent an element of
congestive heart failure.
3.  No significant airspace consolidation.

## 2014-09-12 IMAGING — CT CT HEAD W/O CM
2 series · 17 of 30 positions shown, 20 images · non-contrast
Comparison: CT head without and as 08/04/2012.

CLINICAL DATA: Fall.  Shortness of breath.

CT HEAD WITHOUT CONTRAST
TECHNIQUE: Contiguous axial images were obtained from the base of
the skull through the vertex without contrast.

[Series 2: head w/o · axial · non-contrast · 0.47mm/px · z∈[-552,-432]mm · 9 of 31 slices shown, 12 images]
[im 4/31  brain]
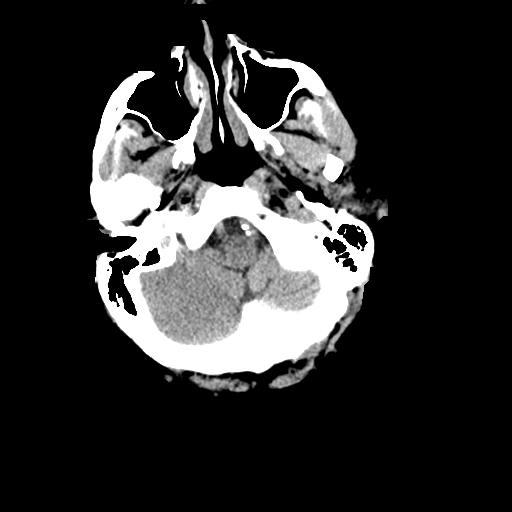
[im 4/31  bone]
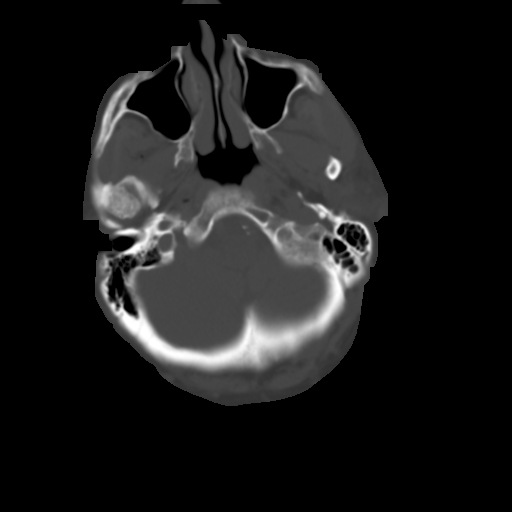
[im 7/31  brain]
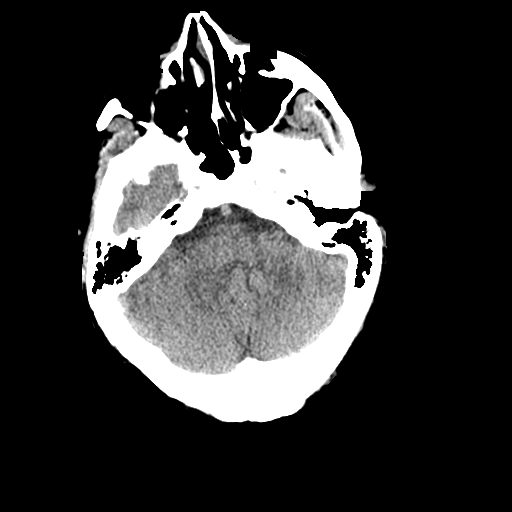
[im 10/31  brain]
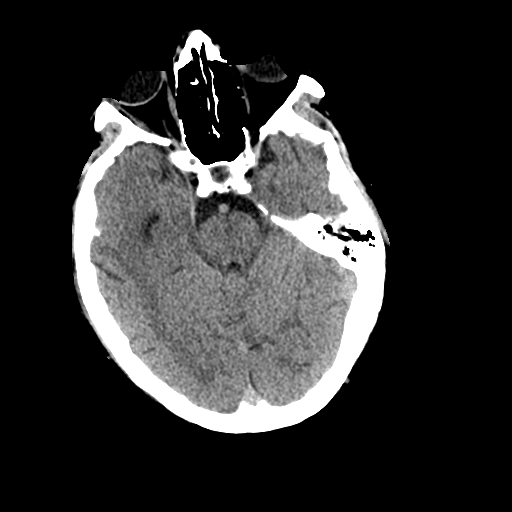
[im 13/31  brain]
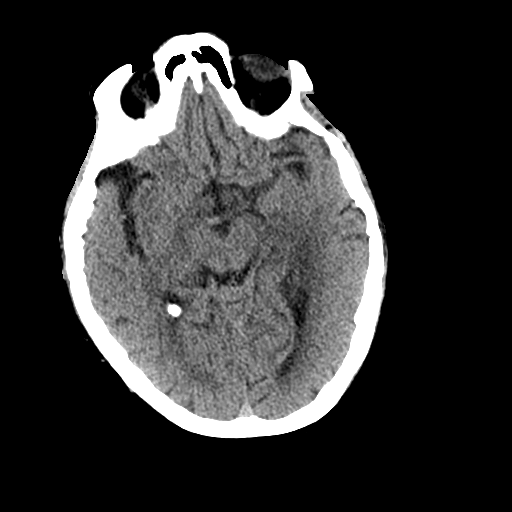
[im 16/31  brain]
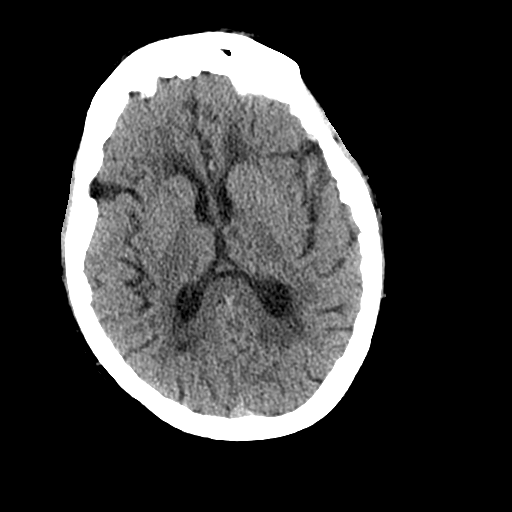
[im 16/31  bone]
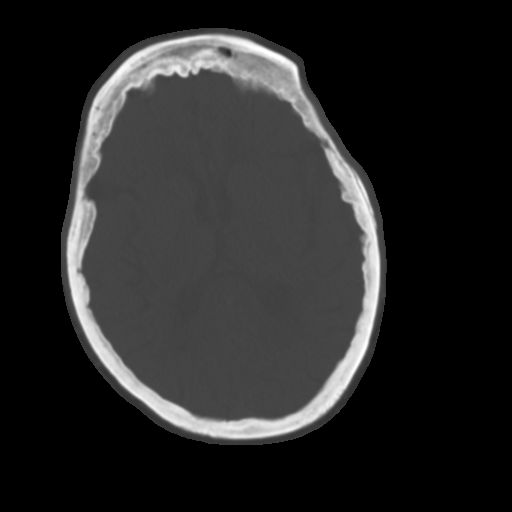
[im 19/31  brain]
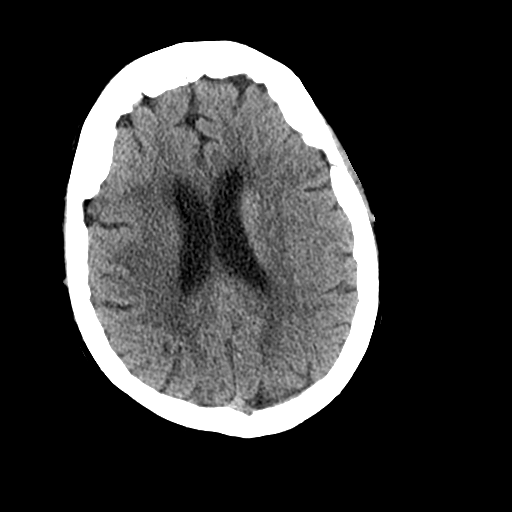
[im 22/31  brain]
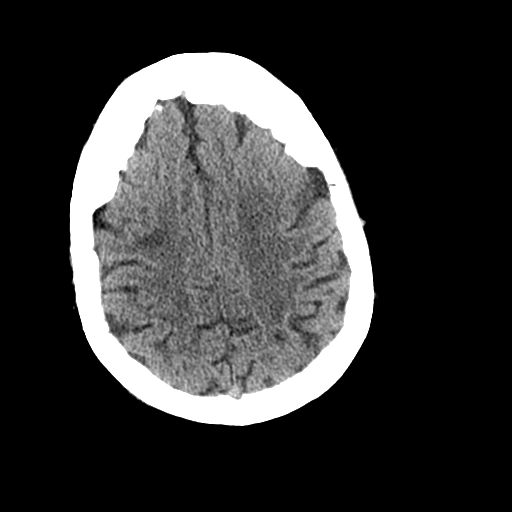
[im 25/31  brain]
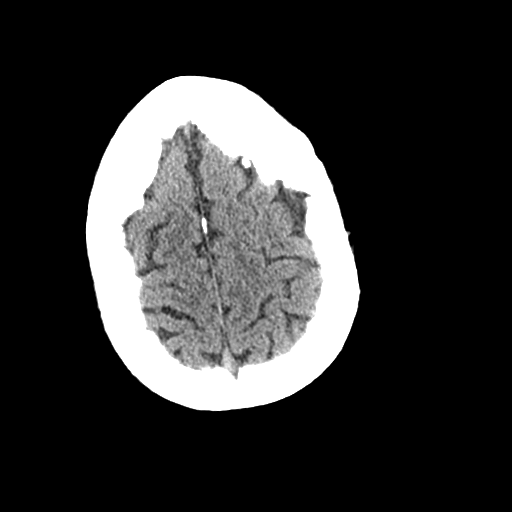
[im 28/31  brain]
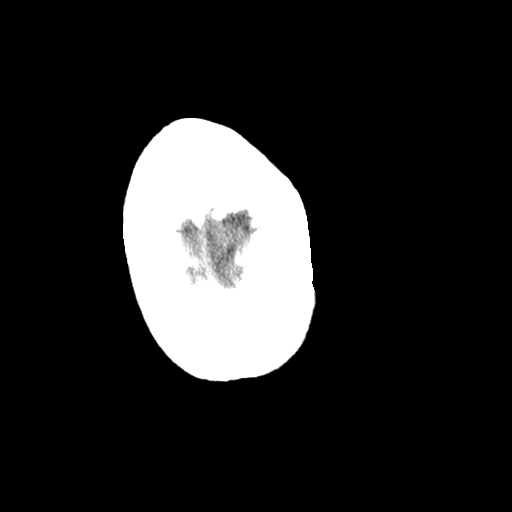
[im 28/31  bone]
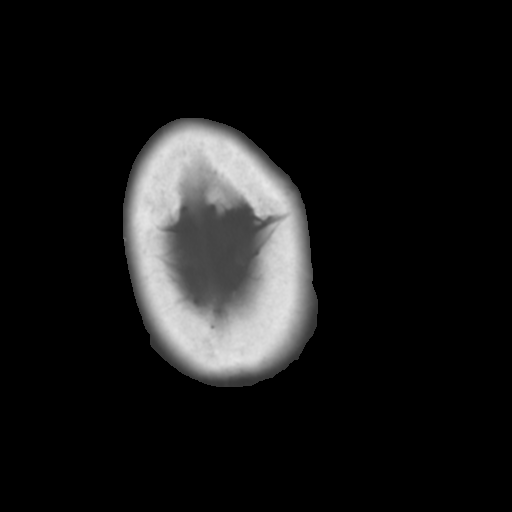

[Series 3: bone windows · axial · 0.47mm/px · z∈[-552,-432]mm · 8 of 52 slices shown]
[im 6/52  bone]
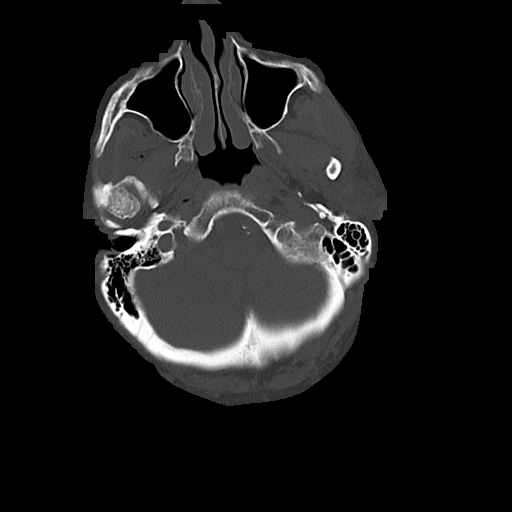
[im 12/52  bone]
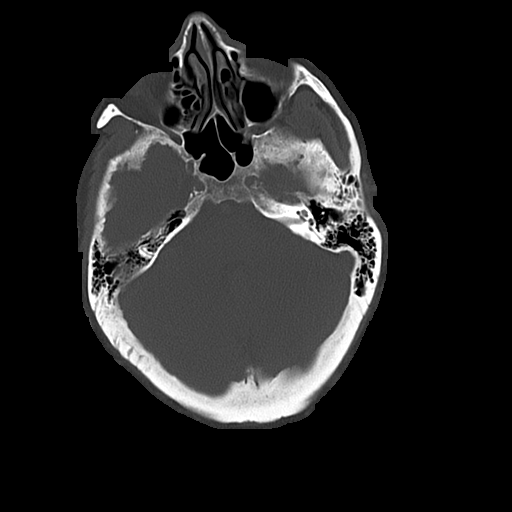
[im 18/52  bone]
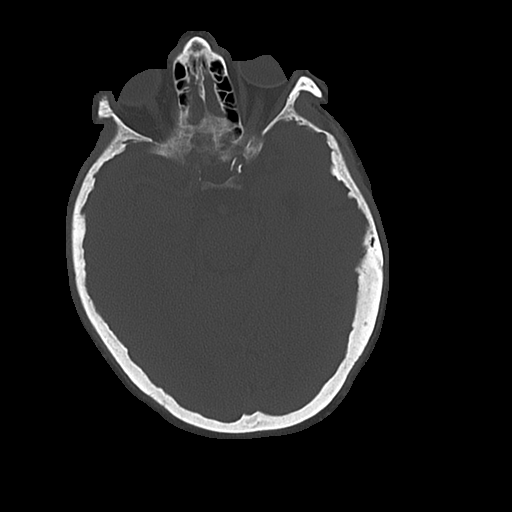
[im 23/52  bone]
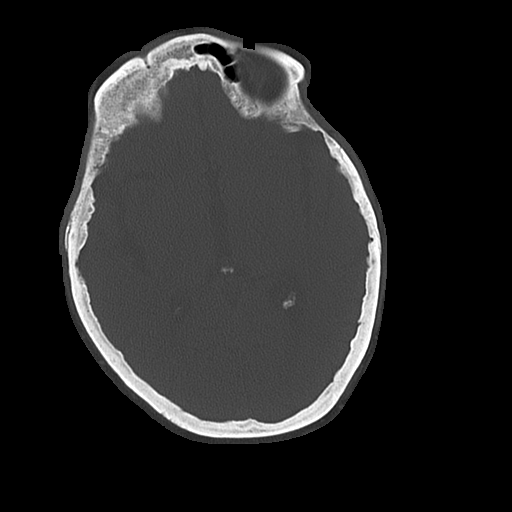
[im 29/52  bone]
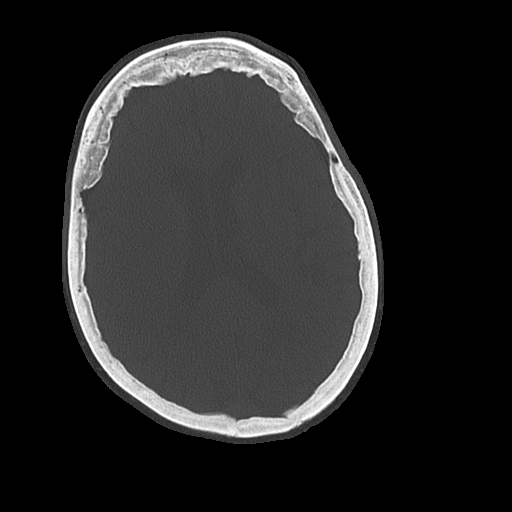
[im 35/52  bone]
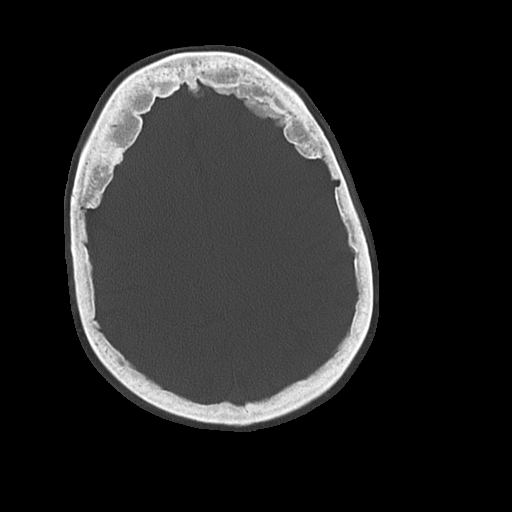
[im 40/52  bone]
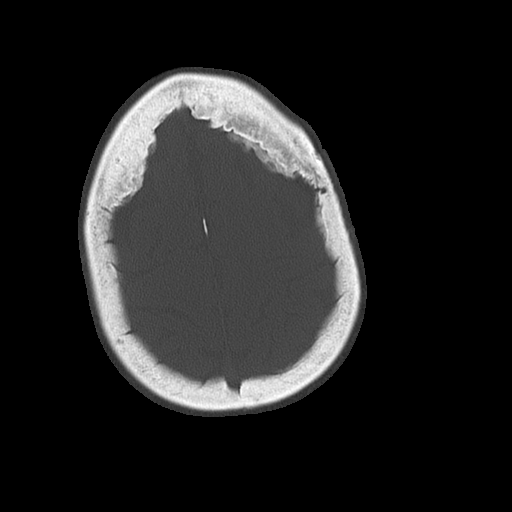
[im 46/52  bone]
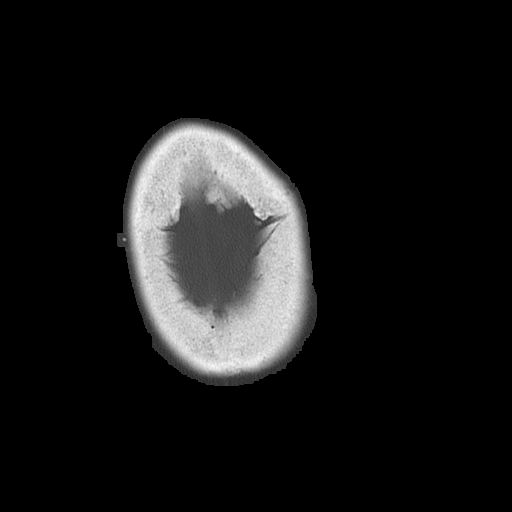

[17 of 30 positions shown; findings below may reference images not displayed]

FINDINGS: Atrophy and extensive white matter disease is not
significantly changed.  No acute cortical infarct, hemorrhage, or
mass lesion is present.  The ventricles are proportionate to the
degree of atrophy.  No significant extra-axial fluid collection is
present.  Secretions are present posteriorly in the right ethmoid
air cells.  The paranasal sinuses and mastoid air cells are
otherwise clear.
IMPRESSION: 1.  Stable atrophy and diffuse white matter disease.
2.  Right ethmoid sinus disease.
3.  No acute intracranial abnormality.

## 2014-09-21 IMAGING — CR DG CHEST 2V
2 series · 2 of 2 positions shown · non-contrast
Comparison: Chest radiograph performed 09/23/2012

CLINICAL DATA: Status post fall; cough.  Concern for chest injury.

CHEST - 2 VIEW

[w chest lat]
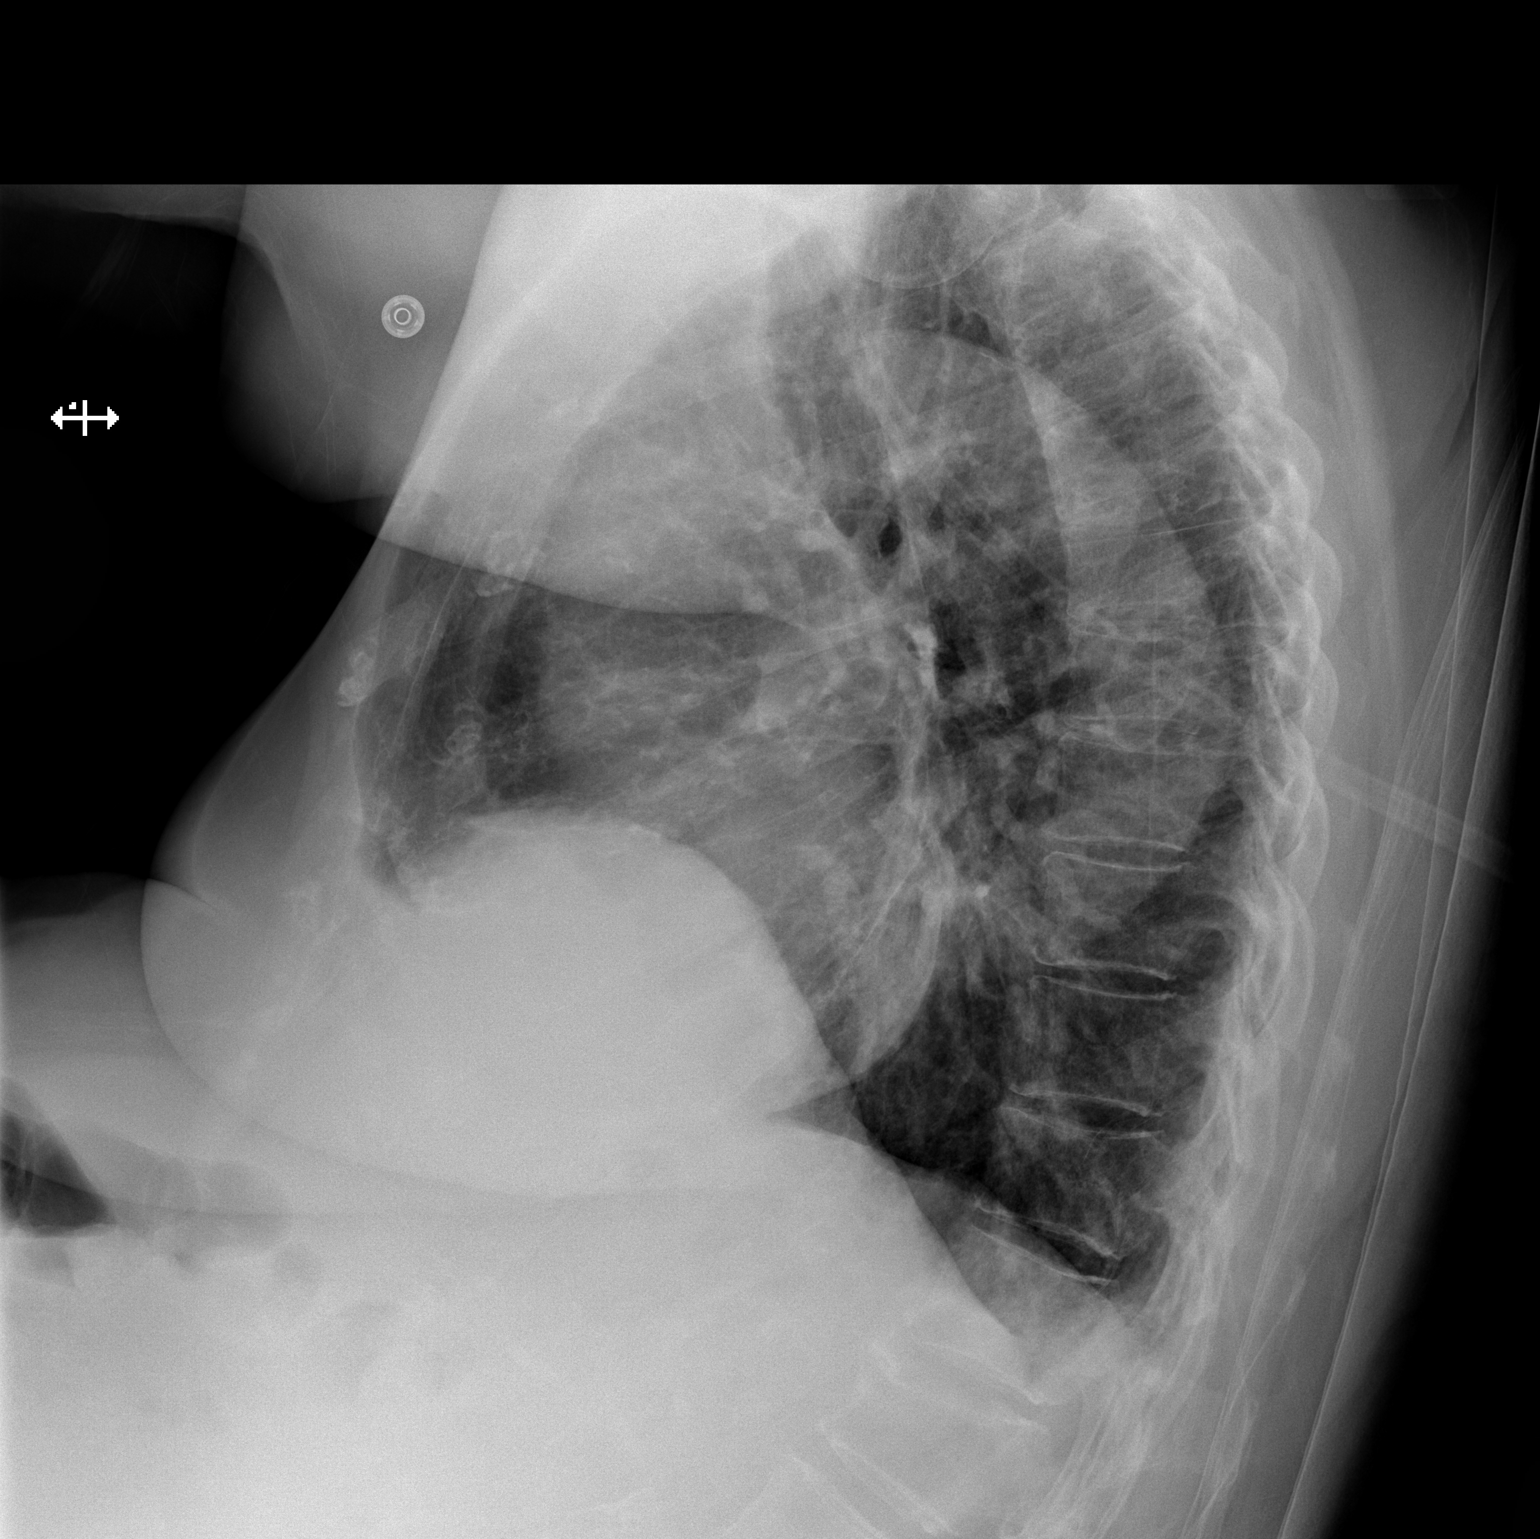

[x chest ap]
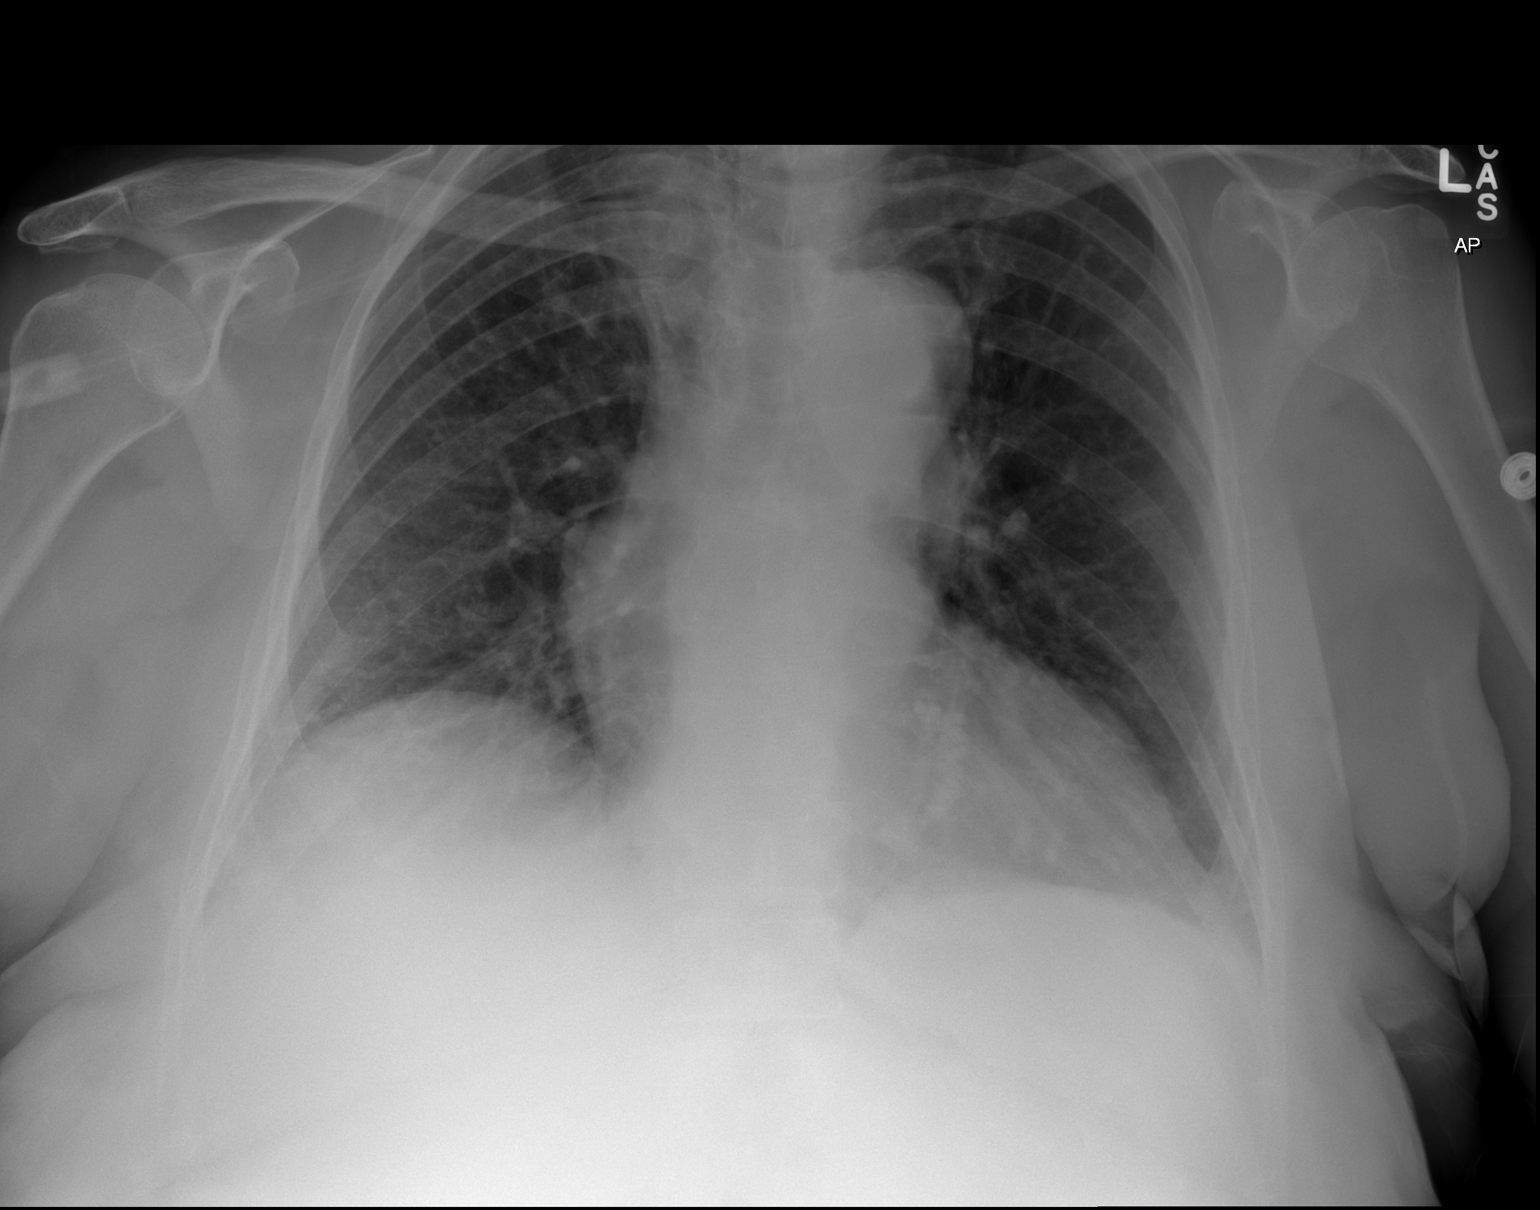

[2 of 2 positions shown; findings below may reference images not displayed]

FINDINGS: The lungs are relatively well-aerated.  Pulmonary
vascularity is at the upper limits of normal.  Minimal bibasilar
opacities likely reflect atelectasis.  No pleural effusion or
pneumothorax is seen.

The heart is borderline normal in size.  No acute osseous
abnormalities are seen.  A mild chronic compression deformity is
noted at the upper lumbar spine.
IMPRESSION: Minimal bibasilar opacities likely reflect atelectasis; lungs
otherwise clear.  No displaced rib fractures seen.
